# Patient Record
Sex: Female | Born: 1980 | Marital: Married | Smoking: Never smoker
Health system: Northeastern US, Community
[De-identification: ages and names within clinical notes are randomized; demographics above are authoritative.]

## PROBLEM LIST (undated history)

## (undated) VITALS — BP 101/59 | HR 64 | Temp 97.4°F | Resp 16 | Ht 64.5 in | Wt 178.0 lb

## (undated) DIAGNOSIS — N12 Tubulo-interstitial nephritis, not specified as acute or chronic: Secondary | ICD-10-CM

## (undated) HISTORY — DX: Tubulo-interstitial nephritis, not specified as acute or chronic: N12

## (undated) HISTORY — PX: OB ANTEPARTUM CARE CESAREAN DLVR & POSTPARTUM: REP299

## (undated) HISTORY — PX: CHOLECYSTECTOMY: GID484

## (undated) HISTORY — PX: LIPOSUCTION: SHX10

---

## 2005-02-11 HISTORY — PX: LAPAROSCOPY SURG CHOLECYSTECTOMY: GID642

## 2010-11-03 ENCOUNTER — Emergency Department (HOSPITAL_BASED_OUTPATIENT_CLINIC_OR_DEPARTMENT_OTHER)
Admission: RE | Admit: 2010-11-03 | Disposition: A | Discharge status: Home / Self Care | Payer: Self-pay | Source: Emergency Department | Attending: Emergency Medicine | Admitting: Emergency Medicine

## 2010-11-03 ENCOUNTER — Encounter (HOSPITAL_BASED_OUTPATIENT_CLINIC_OR_DEPARTMENT_OTHER): Payer: Self-pay | Admitting: Emergency Medicine

## 2010-11-03 LAB — URINALYSIS
BILIRUBIN, URINE: NEGATIVE
CASTS: NONE SEEN PER LPF
CRYSTALS: NONE SEEN
GLUCOSE, URINE: NEGATIVE MG/DL
KETONE, URINE: NEGATIVE MG/DL
NITRITE, URINE: NEGATIVE
PH URINE: 5.5 (ref 5.0–8.0)
PROTEIN, URINE: NEGATIVE MG/DL
RENAL EPITHELIAL CELLS: NONE SEEN PER LPF (ref 0–?)
SPECIFIC GRAVITY URINE: 1.02 (ref 1.003–1.035)
TRANSITIONAL EPITHELIALS: NONE SEEN PER LPF (ref 0–2)

## 2010-11-03 LAB — URINE DIP (POINT OF CARE)
BILIRUBIN, URINE: NEGATIVE
GLUCOSE, URINE: NEGATIVE mg/dl
KETONE, URINE: NEGATIVE mg/dl
NITRITE, URINE: NEGATIVE
PH URINE: 5.5 (ref 5.0–8.0)
PROTEIN, URINE: NEGATIVE mg/dl (ref 0–15)
SPECIFIC GRAVITY URINE: 1.02 (ref 1.003–1.030)
UROBILINOGEN URINE: 0.2 mg/dl (ref 0.2–1.0)

## 2010-11-03 LAB — BASIC METABOLIC PANEL
ANION GAP: 3 mmol/L (ref 3–11)
BUN (UREA NITROGEN): 13 mg/dl (ref 6–20)
CALCIUM: 8.8 mg/dl (ref 8.6–10.3)
CARBON DIOXIDE: 27 mmol/L (ref 22–32)
CHLORIDE: 104 mmol/L (ref 101–111)
CREATININE: 0.8 mg/dl (ref 0.4–1.2)
ESTIMATED GLOMERULAR FILT RATE: >60 mL/min (ref >60)
Glucose Random: 73 mg/dl — ABNORMAL LOW (ref 74–160)
POTASSIUM: 3.4 mmol/L — ABNORMAL LOW (ref 3.5–5.1)
SODIUM: 134 mmol/L — ABNORMAL LOW (ref 135–144)

## 2010-11-03 LAB — CBC WITH PLATELET
HEMATOCRIT: 41.2 % (ref 36.0–48.0)
HEMOGLOBIN: 13.5 g/dl (ref 12.0–16.0)
MEAN CORP HGB CONC: 32.8 g/dl (ref 32.0–36.0)
MEAN CORPUSCULAR HGB: 28.9 pg (ref 27.0–33.0)
MEAN CORPUSCULAR VOL: 88 fl (ref 80.0–100.0)
MEAN PLATELET VOLUME: 8.4 fl (ref 6.4–10.8)
PLATELET COUNT: 205 10*3/uL (ref 150–400)
RBC DISTRIBUTION WIDTH: 14.6 % — ABNORMAL HIGH (ref 11.5–14.3)
RED BLOOD CELL COUNT: 4.68 M/uL (ref 4.50–5.10)
WHITE BLOOD CELL COUNT: 5 10*3/uL (ref 4.0–10.8)

## 2010-11-03 LAB — URINE PREGNANCY TEST (POINT OF CARE): HCG QUALITATIVE URINE: NEGATIVE

## 2010-11-03 LAB — HOLD GREEN TOP TUBE

## 2010-11-03 LAB — HOLD BLUE TOP TUBE

## 2010-11-03 MED ORDER — IBUPROFEN 600 MG PO TABS
600.0000 mg | ORAL_TABLET | Freq: Three times a day (TID) | ORAL | Status: DC | PRN
Start: 2010-11-03 — End: 2012-02-20

## 2010-11-03 MED ORDER — CIPROFLOXACIN IN D5W 400 MG/200ML IV SOLN
400.00 mg | Freq: Once | INTRAVENOUS | Status: AC
Start: 2010-11-03 — End: 2010-11-03
  Administered 2010-11-03: 400 mg via INTRAVENOUS
  Filled 2010-11-03: qty 200

## 2010-11-03 MED ORDER — ONDANSETRON HCL 4 MG PO TABS
4.00 mg | ORAL_TABLET | Freq: Four times a day (QID) | ORAL | Status: AC | PRN
Start: 2010-11-03 — End: 2010-12-03

## 2010-11-03 MED ORDER — ONDANSETRON HCL 4 MG/2ML IJ SOLN
4.00 mg | Freq: Once | INTRAMUSCULAR | Status: AC
Start: 2010-11-03 — End: 2010-11-03
  Administered 2010-11-03: 4 mg via INTRAVENOUS
  Filled 2010-11-03: qty 2

## 2010-11-03 MED ORDER — CIPROFLOXACIN HCL 500 MG PO TABS
500.00 mg | ORAL_TABLET | Freq: Two times a day (BID) | ORAL | Status: AC
Start: 2010-11-03 — End: 2010-11-13

## 2010-11-03 MED ORDER — PHENAZOPYRIDINE HCL 200 MG PO TABS
200.00 mg | ORAL_TABLET | Freq: Three times a day (TID) | ORAL | Status: AC | PRN
Start: 2010-11-03 — End: 2010-11-10

## 2010-11-03 MED ORDER — POTASSIUM CHLORIDE 20 MEQ OR TBCR
40.00 meq | EXTENDED_RELEASE_TABLET | Freq: Once | ORAL | Status: AC
Start: 2010-11-03 — End: 2010-11-03
  Administered 2010-11-03: 40 meq via ORAL
  Filled 2010-11-03: qty 2

## 2010-11-03 MED ORDER — SODIUM CHLORIDE 0.9 % IV BOLUS
1000.00 mL | Freq: Once | INTRAVENOUS | Status: AC
Start: 2010-11-03 — End: 2010-11-03
  Administered 2010-11-03: 1000 mL via INTRAVENOUS

## 2010-11-03 MED ORDER — KETOROLAC TROMETHAMINE 30 MG/ML IJ SOLN
30.00 mg | Freq: Once | INTRAMUSCULAR | Status: AC
Start: 2010-11-03 — End: 2010-11-03
  Administered 2010-11-03: 30 mg via INTRAVENOUS
  Filled 2010-11-03: qty 1

## 2010-11-03 NOTE — Discharge Instructions (Signed)
Thank you for visiting the [facility] Emergency Department.   We have not found any emergent physical exam or lab abnormalities that would require admission to the hospital today.     If you had a CT scan or X-Ray performed between the hours of 5 PM-7 AM your results are only preliminary.  Final CT and X-Ray reports will be available through medical records within 24 hours.   Sometimes incidental findings are found on your radiographs after you leave the ED that will require non-emergent  follow up.    Diagnosis or diagnoses:  Pyelonephritis (Urinary tract infection that involves the kidneys)    What we did in the Emergency Department (ED):  Urine analysis- which showed evidence of a urinary tract infection  Urine culture-this will take 1-2 days return, but will ensure that you are on the correct antibiotic-you would be called at the number you registered with a a change in your antibiotic is indicated  Blood work-which was normal except for slightly low potassium    Next steps:  - Complete the entire course of antibiotic  - Follow up with your regular doctor within 2 days or call to establish care  - Take Pyridium for burning with urination  - Take Zofran for nausea  - Alternate doses of Tylenol (acetaminophen) with Motrin/Advil (ibuprofen) to control fever/pain.  Wait 6 hours between the SAME kind of medication.  An example schedule follows:       6:00am   Motrin/Advil (ibuprofen)  600 MG = 3 TABLETS, TAKE WITH FOOD!     9:00am   Tylenol (acetaminophen)  975 MG = 3 REGULAR strength tablets     12:00noon   Motrin/Advil     3:00pm   Tylenol     6:00pm   Motrin/Advil     9:00pm   Tylenol    Come back to the Emergency Department (ED) for:  Fever despite 1 day of antibiotics, vomiting such that you cannot tolerate medications/fluids, worsening pain, new back pain, any other concern      If you do not have a Primary Care Physician to follow up with, please let us know.  We have clinics available in [place],  [place], and [place] that we can arrange follow up within 24-48 hours.  It is always important to establish a PCP to take care of your everyday medical needs and make sure you are improving after your ER visit. You are welcome to establish care at [facility] by calling our DocFinder service at   [phone]   Monday-Friday 9am-5p if you do not have a regular doctor.    Please read any other attached instructions, and feel welcome to return to the ED for any concerning symptoms you may have.

## 2010-11-03 NOTE — ED Triage Note (Signed)
Multiple C/O's:  Possible UTI related.  Diffuse low abd/pelvic pain that radiates to RT low flank, denies hematuria, low grade fevers x3 days, & diarrhea.

## 2010-11-03 NOTE — ED Notes (Signed)
Pt education for diagnosis, medications, activity, diet and follow up provided.  Pt given written instructions and verbalized understanding of instructions.  Pt discharged home.

## 2010-11-03 NOTE — ED Provider Notes (Signed)
I have reviewed the ED nursing notes and prior records. I have reviewed the patient's past medical history/problem list, allergies, social history and medication list.  I saw this patient primarily.    History, physical exam and disposition were conducted with an official hospital [place] interpreter.    HPI:  This 30 year old female patient presents with chief complaint of right flank pain.  She has had 3 days of low-grade fevers, burning with urination, increased frequency of urination, right flank pain and suprapubic discomfort.  The pain is located in her right flank and radiates into her abdomen.  She has never had anything similar to this in the past.  There is no vomiting, difficulty breathing, chest pain, diarrhea, black or bloody stool.  She has been taking Motrin for discomfort with some relief, and her last dose was this morning around 7 AM about 9 hours prior to arrival in emergency department.  The pain has been constant for the last 3 days although it waxes and wanes in intensity based on whether or not the patient takes Motrin.    She also report an episode were she passed out in an airport in [place] on the way to [place], and was transported to the emergency department where she was observed overnight. She does not have a regular doctor because she recently moved to the [place] from [place].     ROS: Pertinent positives were reviewed as per the HPI above. All other systems were reviewed and are negative.    Past Medical History/Problem List:  History reviewed. No pertinent past medical history.  There is no problem list on file for this patient.      Past Surgical History:    Past Surgical History    LAPS SURG CHOLECSTC     LIPOSUCTION     OB ANTEPARTUM CARE C DLVR&POSTPARTUM          Medications:     No current facility-administered medications on file prior to encounter.  Current outpatient prescriptions ordered prior to encounter:  ibuprofen (ADVIL,MOTRIN) 600 MG tablet Take 1 tablet by  mouth every 8 (eight) hours as needed for Pain. pain Disp: 20 tablet Rfl: 0         Social History:  Denies smoking tobacco.  Denies alcohol use. Denies use of illicit drugs of abuse.    Allergies:  Review of Patient's Allergies indicates:   Penicillins             Hives    Physical Exam:  BP 112/74  Pulse 68  Temp 96.6 F  Resp 18  Wt 58.968 kg (130 lb)  SpO2 99%  LMP 10/20/2010  GENERAL:  Well-appearing, no distress.  Conversant in full sentences  SKIN:  Warm & Dry, no rash, no bruising.  HEAD:  Atraumatic. PERRL. Anicteric sclera. Oropharynx clear.  NECK:  Supple with full painless ROM at the neck  LUNGS:  Clear to auscultation bilaterally without rales, rhonchi or wheezing.   HEART:  RRR.  No murmurs, rubs, or gallops.   ABDOMEN:  Soft, flat, without distension.  Mild suprapubic discomfort to palpation.  MUSCULOSKELETAL:  No deformities. Well-perfused extremities with  2+ DP/PT/Rad pulses bilaterally. No cyanosis or edema.  GENITOURINARY: Positive right-sided CVA tenderness.   NEUROLOGIC:  Normal speech.  alert & oriented x 3, CNsII-XII grossly intact. Gait normal.  PSYCHIATRIC:  Normal affect    DIAGNOSTICS:   Results for orders placed during the hospital encounter of 11/03/10 (from the past 24 hour(s))  URINE DIP (POINT OF CARE)   Component Value Comment    COLOR yellow      CLARITY cloudy      GLUCOSE, URINE neg      BILIRUBIN, URINE neg      KETONE, URINE neg      SPECIFIC GRAVITY URINE 1.020      OCCULT BLOOD, URINE moderate      PH URINE 5.5      PROTEIN, URINE neg      UROBILINOGEN URINE 0.2      NITRITE, URINE neg      LEUKOCYTE ESTERASE small     URINE PREGNANCY TEST (POINT OF CARE)   Component Value Comment    HCG QUALITATIVE URINE negative      ONBOARD CONTROL PRESENT? yes     CBC + PLT    Collection Time    11/03/10  2:25 PM   Component Value Comment    WHITE BLOOD CELL 5.0      RED BLOOD CELL COUNT 4.68      HEMOGLOBIN 13.5      HEMATOCRIT 41.2      MEAN CORPUSCULAR VOL 88.0       MEAN CORPUSCULAR HGB 28.9      MEAN CORP HGB CONC 32.8      RBC DISTRIBUTION WIDTH 14.6 (*)     PLATELET COUNT 205      MEAN PLATELET VOLUME 8.4     BASIC METABOLIC PANEL    Collection Time    11/03/10  2:25 PM   Component Value Comment    SODIUM 134 (*)     POTASSIUM 3.4 (*)     CHLORIDE 104      CARBON DIOXIDE 27      ANION GAP 3      CALCIUM 8.8      Glucose Random 73 (*)     BLOOD UREA NITROGEN 13      CREATININE 0.8      ESTIMATED GLOMERULAR FILT RATE > 60     URINALYSIS    Collection Time    11/03/10  2:25 PM   Component Value Comment    COLOR YELLOW      CLARITY CLEAR      GLUCOSE, URINE NEGATIVE      BILIRUBIN, URINE NEGATIVE      KETONE, URINE NEGATIVE      SPECIFIC GRAVITY URINE 1.020      OCCULT BLOOD, URINE SMALL (*)     PH URINE 5.5      PROTEIN, URINE NEGATIVE      NITRITE, URINE NEGATIVE      LEUKOCYTE ESTERASE SMALL (*)     MICROSCOPIC SEE RESULTS (*)     WHITE BLOOD CELLS URINE MANY 10-50 (*)     RED BLOOD CELLS URINE FEW 3-4 (*)     BACTERIA 6-9 (*)     CRYSTALS NONE SEEN      CASTS NONE SEEN      SQUAMOUS EPITHELIAL CELLS 0-4      TRANSITIONAL EPITHELIALS NONE SEEN      RENAL EPITHELIAL CELLS NONE SEEN     HOLD GREEN TOPE TUBE    Collection Time    11/03/10  2:25 PM   Component Value Comment    HOLD GREEN TOP RECEIVED IN CHEM     HOLD BLUE TOP TUBE    Collection Time    11/03/10  2:25 PM   Component Value Comment    HOLD BLUE TOP  RECEIVED IN HEMATOL         ECG on my reading: NSR, nl axis/intervals, no evidence of ischemia, no STEMI    ED Course and Medical Decision-making:    The patient is 30 year old female with suprapubic pain, right flank pain, urinary symptoms, and low-grade fevers although she is afebrile at time of evaluation in the emergency department.  She does have urinary tract infection, and her symptoms are consistent with pyelonephritis.  Check blood work which did not show leukocytosis or significant anemia.  We also checked an  electrocardiogram given the history of syncope, and this was reassuring without evidence of predisposing condition to cardiac arrhythmia.  There is no evidence by physical exam her blood work of other significant intra-abdominal pathology such as appendicitis or colitis, so did not think that imaging of her abdomen is indicated at this time. The patient was treated with IV fluids, Zofran, Toradol, and IV ciprofloxacin with improvement in her symptoms.  She'll be discharged home with prescriptions for Cipro x10 days, Motrin, Zofran, Pyridium, as well as a referral to the docfinder number. Medication list and nursing note reviewed.  Reasons to return to the ED were reviewed in detail. The patient agrees with this plan and disposition.    Disposition:  Discharged to home    Condition on  Discharge:  Improved and Stable    Diagnosis/Diagnoses:  590.80BE Pyelonephritis    Critical care time outside of procedures: 0    Warren Lacy MD

## 2010-11-03 NOTE — ED Notes (Signed)
MD at bedside. 

## 2010-11-03 NOTE — ED Notes (Signed)
IVL established, labs drawn/sent by this RN

## 2010-11-05 LAB — EKG

## 2010-11-06 ENCOUNTER — Ambulatory Visit (HOSPITAL_BASED_OUTPATIENT_CLINIC_OR_DEPARTMENT_OTHER): Payer: Commercial Managed Care - PPO | Admitting: Family Medicine

## 2011-01-30 ENCOUNTER — Ambulatory Visit (HOSPITAL_BASED_OUTPATIENT_CLINIC_OR_DEPARTMENT_OTHER): Payer: 59 | Admitting: Internal Medicine

## 2011-03-22 ENCOUNTER — Ambulatory Visit (HOSPITAL_BASED_OUTPATIENT_CLINIC_OR_DEPARTMENT_OTHER): Payer: 59 | Admitting: Family Medicine

## 2011-04-08 ENCOUNTER — Ambulatory Visit (HOSPITAL_BASED_OUTPATIENT_CLINIC_OR_DEPARTMENT_OTHER): Payer: Commercial Managed Care - PPO | Admitting: Family Medicine

## 2011-04-08 ENCOUNTER — Encounter (HOSPITAL_BASED_OUTPATIENT_CLINIC_OR_DEPARTMENT_OTHER): Payer: Self-pay | Admitting: Family Medicine

## 2011-04-08 VITALS — BP 110/70 | HR 72 | Temp 97.9°F | Resp 16 | Ht 66.93 in | Wt 158.0 lb

## 2011-04-08 DIAGNOSIS — E039 Hypothyroidism, unspecified: Principal | ICD-10-CM

## 2011-04-08 DIAGNOSIS — O99281 Endocrine, nutritional and metabolic diseases complicating pregnancy, first trimester: Secondary | ICD-10-CM | POA: Insufficient documentation

## 2011-04-08 LAB — BASIC METABOLIC PANEL FASTING
ANION GAP: 5 mmol/L (ref 3–11)
BUN (UREA NITROGEN): 13 mg/dl (ref 6–20)
CALCIUM: 9.5 mg/dl (ref 8.6–10.3)
CARBON DIOXIDE: 28 mmol/L (ref 22–32)
CHLORIDE: 105 mmol/L (ref 101–111)
CREATININE: 0.7 mg/dl (ref 0.4–1.2)
ESTIMATED GLOMERULAR FILT RATE: >60 mL/min (ref >60)
GLUCOSE FASTING: 95 mg/dl (ref 74–118)
POTASSIUM: 3.9 mmol/L (ref 3.5–5.1)
SODIUM: 138 mmol/L (ref 135–144)

## 2011-04-08 LAB — CHG LIPOPROTEIN DIRECT MEASUREMENT LDL CHOLESTEROL: LOW DENSITY LIPOPROTEIN DIRECT: 74 mg/dl (ref 0–100)

## 2011-04-08 LAB — CBC WITH PLATELET
HEMATOCRIT: 40.1 % (ref 34.1–44.9)
HEMOGLOBIN: 14.1 g/dL (ref 11.2–15.7)
MEAN CORP HGB CONC: 35.2 g/dL (ref 31.0–37.0)
MEAN CORPUSCULAR HGB: 30.1 pg (ref 26.0–34.0)
MEAN CORPUSCULAR VOL: 85.7 fL (ref 80.0–100.0)
MEAN PLATELET VOLUME: 10.9 fL (ref 8.7–12.5)
PLATELET COUNT: 249 10*3/uL (ref 150–400)
RBC DISTRIBUTION WIDTH STD DEV: 40.7 fL (ref 35.1–46.3)
RBC DISTRIBUTION WIDTH: 13.1 % (ref 11.5–14.3)
RED BLOOD CELL COUNT: 4.68 M/uL (ref 3.90–5.20)
WHITE BLOOD CELL COUNT: 5.5 10*3/uL (ref 4.0–11.0)

## 2011-04-08 LAB — THYROID SCREEN TSH REFLEX FT4: THYROID SCREEN TSH REFLEX FT4: 1.04 u[IU]/mL (ref 0.34–5.60)

## 2011-04-08 LAB — CHG LIPOPROTEIN DIR MEAS HIGH DENSITY CHOLESTEROL: HIGH DENSITY LIPOPROTEIN: 38 mg/dl (ref 35–85)

## 2011-04-08 LAB — CHOLESTEROL: Cholesterol: 131 mg/dl (ref 0–200)

## 2011-04-08 MED ORDER — LEVOTHYROXINE SODIUM 100 MCG PO TABS
100.0000 ug | ORAL_TABLET | Freq: Every day | ORAL | Status: DC
Start: 2011-04-08 — End: 2012-03-05

## 2011-04-08 NOTE — Progress Notes (Signed)
SUBJECTIVE:  Brenda Braun is a 31 year old female who presents as new patient and history of hypothyroidism, needing medication refill.     LMP: 1/22.  Has copper T IUD and having significantly heavy menses.  Would like appointment to evaluate IUD and PAP.  Last TSH was about a year ago in [place].  No change in hair or skin texture.  No heat or cold intolerance.    ROS:  No fevers or chills. No unusually severe headaches. No dyspnea or chest pain. No abdominal pain. No sadness or anxiety that interferes with daily activities, although was feeling lonely when she moved from [place].     ALLLERGIES:   ALLLERGIES:  Review of Patient's Allergies indicates:   Penicillins             Hives      OBJECTIVE:    VITALS: VITALS:  BP 110/70  Pulse 72  Temp(Src) 97.9 F (36.6 C) (Temporal)  Resp 16  Ht 5' 6.93" (1.7 m)  Wt 158 lb (71.668 kg)  BMI 24.80 kg/m2  SpO2 98%  Constitutional:  Well developed, Well nourished, No acute distress, Non-toxic appearance.   HEENT: Normocephalic, Atraumatic, Bilateral external ears normal, TMs clear b/l, Oropharynx moist, No exudates, Nares clear, Conjunctiva with out erythema/injection.  The neck is supple and free of adenopathy or masses, the thyroid is normal without enlargement or nodules.  Cardiovascular: Normal heart rate, No murmurs, No rubs, No gallops.   Thorax & Lungs: Normal breath sounds, No respiratory distress, No wheezing, No chest tenderness.   Abdomen: Soft, Non tender, No guarding, No masses, Normal bowel sounds    ASSESSMENT/PLAN:   244.9 Unspecified hypothyroidism  (primary encounter diagnosis)  Comment:   Plan: levothyroxine (SYNTHROID, LEVOTHROID) 100 MCG         tablet, THYROID SCREEN TSH, COLLECTION VENOUS         BLOOD VENIPUNCTURE, CBC + PLT, BASIC METABOLIC         PANEL FASTING, CHOLESTEROL, HIGH DENSITY         LIPOPROTEIN, LOW DENSITY LIPOPROTEIN,DIRECT          RTC for PAP, CPE and IUD check.  Discussed possibly changing IUD to Mirena  to help with heavy menses.        1. The patient indicates understanding of these issues and agrees with the plan.  2.  The patient is given an After Visit Summary sheet that lists all of their medications with directions, their allergies, orders placed during this encounter, immunization dates, and follow- up instructions.  3. I reviewed the patient's medical information, allergies, and social history and reconciled the patient's medication list.        Electronically signed by: Tawny Asal, MD, 01/01/2010 7:17 PM  This note is electronically signed in the electronic medical record.

## 2011-05-06 ENCOUNTER — Ambulatory Visit (HOSPITAL_BASED_OUTPATIENT_CLINIC_OR_DEPARTMENT_OTHER): Payer: Commercial Managed Care - PPO | Admitting: Family Medicine

## 2011-05-06 ENCOUNTER — Encounter (HOSPITAL_BASED_OUTPATIENT_CLINIC_OR_DEPARTMENT_OTHER): Payer: Self-pay | Admitting: Family Medicine

## 2011-05-06 VITALS — BP 116/68 | HR 75 | Temp 98.4°F | Resp 16 | Wt 161.0 lb

## 2011-05-06 DIAGNOSIS — E039 Hypothyroidism, unspecified: Principal | ICD-10-CM

## 2011-05-06 NOTE — Patient Instructions (Signed)
Results for CHARNIQUA, VANMARTER (MRN [id number]) as of 05/06/2011 13:41   Ref. Range 04/08/2011 16:29   WHITE BLOOD CELL Latest Range: 4.0-11.0 TH/uL 5.5   RED BLOOD CELL COUNT Latest Range: 3.90-5.20 M/uL 4.68   HEMOGLOBIN Latest Range: 11.2-15.7 g/dL 91.4   HEMATOCRIT Latest Range: 34.1-44.9 % 40.1   MEAN CORPUSCULAR HGB Latest Range: 26.0-34.0 pg 30.1   RBC DISTRIBUTION WIDTH STD Latest Range: 35.1-46.3 fL 40.7   MEAN CORP HGB CONC Latest Range: 31.0-37.0 g/dL 78.2   MEAN CORPUSCULAR VOL Latest Range: 80.0-100.0 fL 85.7   MEAN PLATELET VOLUME Latest Range: 8.7-12.5 fL 10.9   RBC DISTRIBUTION WIDTH Latest Range: 11.5-14.3 % 13.1   PLATELET COUNT Latest Range: 150-400 TH/uL 249   SODIUM Latest Range: 135-144 mmol/L 138   POTASSIUM Latest Range: 3.5-5.1 mmol/L 3.9   CHLORIDE Latest Range: 101-111 mmol/L 105   CARBON DIOXIDE Latest Range: 22-32 mmol/L 28   ANION GAP Latest Range: 3-11 mmol/L 5   BLOOD UREA NITROGEN Latest Range: 6-20 mg/dl 13   CREATININE Latest Range: 0.4-1.2 mg/dl 0.7   ESTIMATED GLOMERULAR FILT RATE Latest Range: Low: > 60 ML/MIN > 60   GLUCOSE FASTING Latest Range: 74-118 mg/dl 95   CALCIUM Latest Range: 8.6-10.3 mg/dl 9.5   Cholesterol Latest Range: 0-200 mg/dl 956   HDL Latest Range: 35-85 mg/dl 38   LDL Latest Range: 0-100 mg/dl 74   THYROID SCREEN TSH Latest Range: 0.34-5.60 uIU/mL 1.04

## 2011-05-07 NOTE — Progress Notes (Signed)
SUBJECTIVE:  Brenda Braun is a 31 year old female who presents here for annual gyn, although has her menses.    Thyroid:  No symptoms.  Taking medication.          ALLLERGIES:   ALLLERGIES:  Review of Patient's Allergies indicates:   Penicillins             Hives      OBJECTIVE:    VITALS: VITALS:  BP 116/68  Pulse 75  Temp(Src) 98.4 F (36.9 C) (Temporal)  Resp 16  Wt 161 lb (73.029 kg)  SpO2 99%  Constitutional:  Well developed, Well nourished, No acute distress, Non-toxic appearance.   HEENT: Normocephalic, Atraumatic, Bilateral external ears normal, Oropharynx moist, No exudates, Nares clear, Conjunctiva with out erythema/injection.  Neck:  No TMG.  Cardiovascular: Normal heart rate, No murmurs, No rubs, No gallops.   Thorax & Lungs: Normal breath sounds, No respiratory distress, No wheezing, No chest tenderness.       ASSESSMENT/PLAN:   244.9 Unspecified hypothyroidism  (primary encounter diagnosis)  Comment: taking meds and no symptoms.  Reviewed lab results  Plan: Continue meds.        1. The patient indicates understanding of these issues and agrees with the plan.  2.  The patient is given an After Visit Summary sheet that lists all of their medications with directions, their allergies, orders placed during this encounter, immunization dates, and follow- up instructions.  3. I reviewed the patient's medical information, allergies, and social history and reconciled the patient's medication list.        Electronically signed by: Tawny Asal, MD, 01/01/2010 7:17 PM  This note is electronically signed in the electronic medical record.

## 2011-05-20 ENCOUNTER — Ambulatory Visit (HOSPITAL_BASED_OUTPATIENT_CLINIC_OR_DEPARTMENT_OTHER): Payer: Commercial Managed Care - PPO | Admitting: Family Medicine

## 2011-06-03 ENCOUNTER — Ambulatory Visit (HOSPITAL_BASED_OUTPATIENT_CLINIC_OR_DEPARTMENT_OTHER): Payer: Commercial Managed Care - PPO | Admitting: Family Medicine

## 2011-06-03 ENCOUNTER — Encounter (HOSPITAL_BASED_OUTPATIENT_CLINIC_OR_DEPARTMENT_OTHER): Payer: Self-pay | Admitting: Family Medicine

## 2011-06-03 VITALS — BP 110/70 | HR 68 | Temp 96.9°F | Resp 16 | Wt 160.0 lb

## 2011-06-03 DIAGNOSIS — Z01419 Encounter for gynecological examination (general) (routine) without abnormal findings: Principal | ICD-10-CM

## 2011-06-03 DIAGNOSIS — N760 Acute vaginitis: Secondary | ICD-10-CM

## 2011-06-03 DIAGNOSIS — B9689 Other specified bacterial agents as the cause of diseases classified elsewhere: Secondary | ICD-10-CM

## 2011-06-03 MED ORDER — METRONIDAZOLE 500 MG PO TABS
2.00 g | ORAL_TABLET | Freq: Once | ORAL | Status: AC
Start: 2011-06-03 — End: 2011-06-03

## 2011-06-03 NOTE — Progress Notes (Signed)
31 year old woman here for annual gyn exam. She has not seen her primary care provider for an annual exam in the last year.     Obstetric History    No data available       CC/HPI: routine screening  Constant feeling of vaginal discharge for the last two months.  White milky discharge.No itching or discomfort.    05/07/11 LMP.  Had some cramping, but normal.  History of UTI last was in October 2008        Current outpatient prescriptions:  levothyroxine (SYNTHROID, LEVOTHROID) 100 MCG tablet Take 1 tablet by mouth daily. Disp: 30 tablet Rfl: 12   ibuprofen (ADVIL,MOTRIN) 600 MG tablet Take 1 tablet by mouth every 8 (eight) hours as needed for Pain. pain Disp: 20 tablet Rfl: 0       HRT: not applicable  ALLERGIES:  Penicillins    Menstrual Status:  pre menopausal    LMP: No LMP recorded.     Menses: regular every 1 months, lasting 5 days.  Flow: heavy  Sexual History: sexually active, monogamous  Past GYN History: negative, has IUD  no abnormal paps    Significant OB History: 2 pregnancies, 31yo and 31 yo.      Past Medical History    Pyelonephritis              Past Surgical History    LAPAROSCOPY SURG CHOLECYSTECTOMY     LIPOSUCTION     OB ANTEPARTUM CARE CESAREAN DLVR & POSTPARTUM     CHOLECYSTECTOMY            Social History   Marital Status: Married  Spouse Name: N/A    Years of Education: N/A  Number of Children: N/A     Occupational History  None on file     Social History Main Topics   Smoking status: Never Smoker     Smokeless tobacco:     Alcohol Use: Not on file    Drug Use: Not on file    Sexually Active: Not on file     Other Topics Concern   None on file     Social History Narrative    Moved from [place] 6 months ago.    Lives with husband and children 7yo and 70 yo sons.    No working.     Student english.    Feels safe at home.                  No family history on file.    ROS:  Endocrine: negative  Genitourinary: some discharge for 2 months.      PHYSICAL EXAM:  Breasts: no masses, skin, nipple or  axillary changes, s/p breast surgery.  Abdomen: no masses or tenderness    PELVIC:  External Genitalia: normal architecture  Vagina: well rugated and no lesions  Vaginal Discharge: moderate, white and creamy  Pelvic supports: normal  Cervix: no lesions, strings visualized.  Uterus: anteverted, normal size and non-tender  Adnexa: no masses, nodularity, tenderness  Rectum: deferred  Wet prep shows some clue cells.    ASSESSMENT:  GYN annual exam  BV.    PLAN:  Pap done, Genprobe done, HPV DNA test done, Return in one year or as needed and Metronidazole 2g, x1 today.    COUNSELING:  exercise and alcohol/drug use  Declined immunizations.    .1. The patient indicates understanding of these issues and agrees with the plan.  2.  The patient is  given an After Visit Summary sheet that lists all of their medications with directions, their allergies, orders placed during this encounter, immunization dates, and follow- up instructions.  3. I reviewed the patient's medical information, allergies, and social history and reconciled the patient's medication list.        Electronically signed by: Tawny Asal, MD, 05/15/2009 2:06 PM  This note is electronically signed in the electronic medical record.

## 2011-06-04 DIAGNOSIS — Z30431 Encounter for routine checking of intrauterine contraceptive device: Secondary | ICD-10-CM | POA: Insufficient documentation

## 2011-06-04 LAB — CHLAMYDIA GC NAAT
GENPROBE CHLAMYDIA: NEGATIVE
GENPROBE GC: NEGATIVE

## 2011-06-11 LAB — CYTOPATH, C/V, THIN LAYER

## 2011-06-14 LAB — HUMAN PAPILLOMAVIRUS (HPV): HUMAN PAPILLOMAVIRUS: NEGATIVE

## 2011-06-17 ENCOUNTER — Encounter (HOSPITAL_BASED_OUTPATIENT_CLINIC_OR_DEPARTMENT_OTHER): Payer: Self-pay | Admitting: Family Medicine

## 2011-06-17 ENCOUNTER — Ambulatory Visit (HOSPITAL_BASED_OUTPATIENT_CLINIC_OR_DEPARTMENT_OTHER): Payer: Commercial Managed Care - PPO | Admitting: Family Medicine

## 2011-06-17 VITALS — BP 100/60 | HR 66 | Temp 98.3°F | Wt 158.0 lb

## 2011-06-17 DIAGNOSIS — R87610 Atypical squamous cells of undetermined significance on cytologic smear of cervix (ASC-US): Principal | ICD-10-CM

## 2011-06-17 DIAGNOSIS — E039 Hypothyroidism, unspecified: Secondary | ICD-10-CM

## 2011-06-17 MED ORDER — METRONIDAZOLE 0.75 % VA GEL
1.00 | Freq: Every evening | VAGINAL | Status: AC
Start: 2011-06-17 — End: 2011-06-22

## 2011-06-17 NOTE — Progress Notes (Signed)
SUBJECTIVE:  Brenda Braun is a 31 year old female who presents for f/u of pap and vaginal discharge. Had BV and was given Metronidazole PO, which helped, but still has some milky white discharge.  Reviewed Pap results:  ASCUS with Neg HPV.    ROS:  No fevers or chills. No unusually severe headaches. No dyspnea or chest pain. No abdominal pain.     ALLLERGIES:    Review of Patient's Allergies indicates:   Penicillins             Hives      OBJECTIVE:    VITALS: VITALS:  BP 100/60  Pulse 66  Temp(Src) 98.3 F (36.8 C) (Temporal)  Wt 158 lb (71.668 kg)  SpO2 99%  Constitutional:  Well developed, Well nourished, No acute distress, Non-toxic appearance.   HEENT: Normocephalic, Atraumatic,Conjunctiva with out erythema/injection.    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  Plan: f/u for yearly exam.  Pap q 3 years.    244.9 Unspecified hypothyroidism  Comment: stable  Plan: verified at pharmacy that she has refills.    Vaginal D/c:  Trial of Metrogel vaginally for 5 days to complete treatment.  If still with discharge, will consider culture and further evaluation        1. The patient indicates understanding of these issues and agrees with the plan.  2.  The patient is given an After Visit Summary sheet that lists all of their medications with directions, their allergies, orders placed during this encounter, immunization dates, and follow- up instructions.  3. I reviewed the patient's medical information, allergies, and social history and reconciled the patient's medication list.        Electronically signed by: Tawny Asal, MD, 06/17/2011, 12:56 PM    This note is electronically signed in the electronic medical record.

## 2012-02-20 ENCOUNTER — Ambulatory Visit (HOSPITAL_BASED_OUTPATIENT_CLINIC_OR_DEPARTMENT_OTHER): Payer: Self-pay

## 2012-02-20 ENCOUNTER — Ambulatory Visit (HOSPITAL_BASED_OUTPATIENT_CLINIC_OR_DEPARTMENT_OTHER): Payer: Commercial Managed Care - PPO

## 2012-02-20 VITALS — BP 110/70 | HR 56 | Temp 97.9°F | Wt 154.0 lb

## 2012-02-20 DIAGNOSIS — E039 Hypothyroidism, unspecified: Principal | ICD-10-CM

## 2012-02-20 DIAGNOSIS — F513 Sleepwalking [somnambulism]: Secondary | ICD-10-CM

## 2012-02-20 DIAGNOSIS — G43909 Migraine, unspecified, not intractable, without status migrainosus: Secondary | ICD-10-CM

## 2012-02-20 DIAGNOSIS — M25559 Pain in unspecified hip: Principal | ICD-10-CM

## 2012-02-20 DIAGNOSIS — W108XXA Fall (on) (from) other stairs and steps, initial encounter: Secondary | ICD-10-CM

## 2012-02-20 DIAGNOSIS — M7550 Bursitis of unspecified shoulder: Secondary | ICD-10-CM

## 2012-02-20 DIAGNOSIS — Z23 Encounter for immunization: Secondary | ICD-10-CM

## 2012-02-20 DIAGNOSIS — M25552 Pain in left hip: Secondary | ICD-10-CM

## 2012-02-20 LAB — XR PELVIS AP 1 OR 2 VIEWS

## 2012-02-20 LAB — THYROID SCREEN TSH REFLEX FT4: THYROID SCREEN TSH REFLEX FT4: 2.82 u[IU]/mL (ref 0.34–5.60)

## 2012-02-20 LAB — XR HIP LEFT MINIMUM 2 VIEWS

## 2012-02-20 MED ORDER — IBUPROFEN 800 MG PO TABS
800.0000 mg | ORAL_TABLET | Freq: Four times a day (QID) | ORAL | Status: DC | PRN
Start: 2012-02-20 — End: 2014-03-14

## 2012-02-20 MED ORDER — SUMATRIPTAN SUCCINATE 100 MG PO TABS
100.0000 mg | ORAL_TABLET | ORAL | Status: DC | PRN
Start: 2012-02-20 — End: 2013-11-08

## 2012-02-20 NOTE — Progress Notes (Signed)
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      Who presents for the following today:    1) Headaches  -come and go for many years, since she was a teenager  -unilateral, throbbing pain-usually on the left side  +Nausea  +Photophobia  +Dizziness- worse with head movement  +Tinnitus  -Headaches occur more often closer to her period  -Ibuprofen, tylenol with relief  +Nose bleeds-sometimes occur during headache  -No change in vision, trouble walking, loss of consciousness      2) Sleep walking  -Her husband noticed that she walks around in the middle of the night  -sometimes tries to get the keys and leave, laughs  -converses with husband while sleep walking  -he has witnessed her hitting her head while running into walls  -This occurs approximately 3 night per week for the past year  -Very tired the next day   -never remembers any of her sleep walking events      3) Left leg pain  -2 months ago fell down the stairs   -tripped and fell down 3 steps  -Fell on her left side, her left leg/buttocks area  -pain is worse with standing for long periods of time   -Relieved with rest  -No associated numbness, tingling, change in gait  -no other trauma    4) Right shoulder pain  - 2 months ago started having pain in her anterior "joint"  -occupation is folding clothes  -worse with moving shoulder at work   -no relieving factors  - no associated numbness, tingling,weakness    5) Hypothyroidism follow up  -taking 100 mcg Synthroid daily  -no heat/cold intolerance, changes in skin/hair/nails    Medications:  Current outpatient prescriptions:paragard intrauterine copper device, 1 Device by Intrauterine route once. As instructed , Disp: , Rfl: ;  levothyroxine (SYNTHROID, LEVOTHROID) 100 MCG tablet, Take 1 tablet  by mouth daily., Disp: 30 tablet, Rfl: 12;  ibuprofen (ADVIL,MOTRIN) 600 MG tablet, Take 1 tablet by mouth every 8 (eight) hours as needed for Pain. pain, Disp: 20 tablet, Rfl: 0      Allergies:  Review of Patient's Allergies indicates:   Penicillins             Hives        OBJECTIVE    PHYSICAL EXAM:  Vital Signs  BP 110/70  Pulse 56  Temp(Src) 97.9 F (36.6 C) (Temporal)  Wt 154 lb (69.854 kg)  BMI 24.17 kg/m2  SpO2 100%           Physical Exam   Constitutional: She appears well-nourished. She is cooperative. No distress.   Well groomed, well developed [place] female sitting comfortably on exam table. Accompanied by husband   HENT:   Head: Normocephalic and atraumatic.   Eyes: EOM are normal. Pupils are equal, round, and reactive to light.   Neck: Normal range of motion. No spinous process tenderness and no muscular tenderness present. No rigidity. No mass and no thyromegaly present.   Cardiovascular: Normal rate, regular rhythm, S1 normal and S2 normal.  Exam reveals no gallop.    No murmur heard.  Pulses:       Radial pulses are 2+ on the  right side, and 2+ on the left side.        Dorsalis pedis pulses are 2+ on the right side, and 2+ on the left side.   Pulmonary/Chest: Effort normal. She has no decreased breath sounds. She has no wheezes.   Musculoskeletal:        Right shoulder: She exhibits normal range of motion, no swelling, no effusion and no deformity.   Right shoulder:  -Tenderness to palpation in bicipital groove  +Neer Impingement sign  -Normal ROM    Left Hip:  FAROM  Tender to palpation over greater tronchater and ASIS  No swelling, crepitus       Neurological: She has normal strength. No cranial nerve deficit or sensory deficit. Gait normal.   Reflex Scores:       Patellar reflexes are 2+ on the right side and 2+ on the left side.  Skin: Skin is warm, dry and intact. No abrasion, no bruising and no ecchymosis noted. No erythema.   Psychiatric: Her speech is normal and behavior is normal.  Her mood appears anxious.         ASSESSMENT/PLAN  (244.9) Unspecified hypothyroidism  (primary encounter diagnosis)  Comment:Stable on current medication  Plan: THYROID SCREEN TSH REFLEX FT4    (719.45) Left hip pain  (E880.9) Fall down stairs.   XRAY to rule out bony abnormality. Works on her feet all day, may have early osteoarthritis vs. Bursitis.   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 Never had sleep studies.  Plan: REFERRAL TO SLEEP SPECIALIST (INT)    (726.19) Subacromial bursitis  Comment:   Plan: ibuprofen (ADVIL,MOTRIN) 800 MG tablet,         COLLECTION VENOUS BLOOD VENIPUNCTURE  -consider PT if no improvement    (346.90) Migraines  Comment: Counseled on potential triggers of headaches. Advised use of NSAIDs, fluids, use of Imitrex only if NSAIDs not working.  Plan: SUMAtriptan (IMITREX) 100 MG tablet  -return to care for follow up      I have spent 40 minutes in face to face time with this patient/patient proxy of which > 50% was in counseling or coordination of care regarding above issues/Dx.            1. The patient indicates understanding of these issues and agrees with the plan.  2.  The patient is given an After Visit Summary sheet that lists all of their medications with directions, their allergies, orders placed during this encounter, immunization dates, and follow- up instructions.  3. I reviewed the patient's medical information and medical history   4.  I reconciled the patient's medication list and prepared and supplied needed refills.  5.  I have reviewed the past medical, family, and social history sections including the medications and allergies listed in the above medical  record  Yuri Fana PA-C, 02/20/2012, 3:17 PM

## 2012-02-20 NOTE — Patient Instructions (Signed)
Dor no Ombro  (Shoulder Pain)   O ombro é a junta que conecta seus braços ao seu corpo. Os ossos que formam a junta do ombro incluem o osso do braço (úmero), a omoplata (espádua) e a clavícula (clavícula). A parte superior do úmero tem um formato esférico e se encaixa em uma cavidade plana da espádua (cavidade glenoidal). Uma combinação de músculos e tecidos fibrosos e fortes que ligam músculos a ossos (tendões) apoiam a junta de seu ombro e mantém a esfera na cavidade. Pequenas bolsas com fluidos (cavidades) estão localizadas em diferentes áreas da junta. Elas atuam como amortecedores entre os ossos e os tecidos finos superiores e ajudam a reduzir a fricção entre os tendões deslizantes e o osso conforme você move seu braço. A junta de seu ombro possibilita um grande alcance de movimentos em seu braço. Esse alcance de movimento possibilita que você realize tarefas como coçar as costas ou arremessar uma bola. No entanto, este alcance também torna seu ombro mais suscetível a dor devido a uso intenso e lesões.  As causas de dor no ombro podem ser provenientes de lesões ou uso intenso e normalmente podem ser agrupadas nas quatro categorias abaixo:   · Vermelhidão, inchaço e dor (inflamação) do tendão (tendinite) ou das cavidades (bursite).  · Instabilidade, como deslocamento da junta.  · Inflamação da junta (artrite).  · Osso quebrado (fratura).  INSTRUÇÕES PARA TRATAMENTO DOMICILIAR   · Aplique gelo na área lesionada  · Coloque gelo em uma sacola plástica.  · Coloque uma toalha entre a pele e a sacola.  · Deixe o gelo sobre a área por 15 a 20 minutos, 3 a 4 vezes por dia nos 2 primeiros dias.  · Caso você tenha uma tipoia ou imobilizador, use-os pelo tempo orientado por seu médico. Somente remova-os para tomar banho. Mova seu braço o mínimo possível, mas mantenha sua mão em movimento para evitar o inchaço.  · Somente administre remédios de venda livre ou prescritos contra dor, desconforto ou febre conforme orientado  por seu médico.  PROCURE UM MÉDICO SE:   · A dor de seu ombro aumentar ou aparecerem novas dores no braço, mão ou dedos.  · Sua mão ou dedos se tornarem frios ou dormentes.  · Não houver alívio de dor com remédios.  qqPROCURE UM MÉDICO IMEDIATAMENTE SE:   · Seu braço, mão ou dedos estiverem dormentes ou formigando.  · Seu braço, mão ou dedos estiverem inchados significativamente ou ficarem brancos ou azuis.  CERTIFIQUE-SE DE:   · Compreender estas instruções.  · Observar as suas condições.  · Procurar um médico imediatamente se não se sentir bem ou piorar.  Document Released: 07/18/2009 Document Revised: 04/22/2011  [employer]® Patient Information ©2013 [employer].

## 2012-02-21 ENCOUNTER — Encounter (HOSPITAL_BASED_OUTPATIENT_CLINIC_OR_DEPARTMENT_OTHER): Payer: Self-pay | Admitting: Family Medicine

## 2012-03-05 ENCOUNTER — Ambulatory Visit (HOSPITAL_BASED_OUTPATIENT_CLINIC_OR_DEPARTMENT_OTHER): Payer: Commercial Managed Care - PPO

## 2012-03-05 ENCOUNTER — Encounter (HOSPITAL_BASED_OUTPATIENT_CLINIC_OR_DEPARTMENT_OTHER): Payer: Self-pay

## 2012-03-05 VITALS — BP 108/68 | HR 65 | Temp 98.1°F | Wt 154.0 lb

## 2012-03-05 DIAGNOSIS — Z309 Encounter for contraceptive management, unspecified: Secondary | ICD-10-CM

## 2012-03-05 DIAGNOSIS — N920 Excessive and frequent menstruation with regular cycle: Secondary | ICD-10-CM

## 2012-03-05 DIAGNOSIS — R233 Spontaneous ecchymoses: Secondary | ICD-10-CM

## 2012-03-05 DIAGNOSIS — E039 Hypothyroidism, unspecified: Secondary | ICD-10-CM

## 2012-03-05 DIAGNOSIS — M25559 Pain in unspecified hip: Principal | ICD-10-CM

## 2012-03-05 DIAGNOSIS — Z30431 Encounter for routine checking of intrauterine contraceptive device: Secondary | ICD-10-CM

## 2012-03-05 DIAGNOSIS — M7550 Bursitis of unspecified shoulder: Secondary | ICD-10-CM

## 2012-03-05 DIAGNOSIS — R238 Other skin changes: Secondary | ICD-10-CM

## 2012-03-05 LAB — CBC WITH PLATELET
HEMATOCRIT: 39 % (ref 34.1–44.9)
HEMOGLOBIN: 13.7 g/dL (ref 11.2–15.7)
MEAN CORP HGB CONC: 35.1 g/dL (ref 31.0–37.0)
MEAN CORPUSCULAR HGB: 30.4 pg (ref 26.0–34.0)
MEAN CORPUSCULAR VOL: 86.7 fL (ref 80.0–100.0)
MEAN PLATELET VOLUME: 10.3 fL (ref 8.7–12.5)
PLATELET COUNT: 254 10*3/uL (ref 150–400)
RBC DISTRIBUTION WIDTH STD DEV: 42.1 fL (ref 35.1–46.3)
RBC DISTRIBUTION WIDTH: 13.2 % (ref 11.5–14.3)
RED BLOOD CELL COUNT: 4.5 M/uL (ref 3.90–5.20)
WHITE BLOOD CELL COUNT: 4.3 10*3/uL (ref 4.0–11.0)

## 2012-03-05 MED ORDER — LEVOTHYROXINE SODIUM 100 MCG PO TABS
100.0000 ug | ORAL_TABLET | Freq: Every day | ORAL | Status: DC
Start: 2012-03-05 — End: 2012-12-03
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
## 2012-03-17 ENCOUNTER — Encounter (HOSPITAL_BASED_OUTPATIENT_CLINIC_OR_DEPARTMENT_OTHER): Payer: Self-pay | Admitting: Geriatric Medicine

## 2012-03-17 ENCOUNTER — Ambulatory Visit (HOSPITAL_BASED_OUTPATIENT_CLINIC_OR_DEPARTMENT_OTHER): Payer: Commercial Managed Care - PPO | Admitting: Geriatric Medicine

## 2012-03-17 VITALS — BP 110/70 | HR 66 | Temp 97.9°F

## 2012-03-17 DIAGNOSIS — Z30430 Encounter for insertion of intrauterine contraceptive device: Principal | ICD-10-CM

## 2012-03-17 DIAGNOSIS — Z30432 Encounter for removal of intrauterine contraceptive device: Secondary | ICD-10-CM

## 2012-03-17 DIAGNOSIS — N920 Excessive and frequent menstruation with regular cycle: Secondary | ICD-10-CM

## 2012-03-17 LAB — URINE PREGNANCY TEST (POINT OF CARE): HCG QUALITATIVE URINE: NEGATIVE

## 2012-03-17 MED ORDER — LEVONORGESTREL 20 MCG/24HR IU IUD
1.0000 | INTRAUTERINE_SYSTEM | INTRAUTERINE | Status: DC
Start: 2012-03-17 — End: 2014-03-14

## 2012-03-17 MED ORDER — IBUPROFEN 600 MG PO TABS
600.0000 mg | ORAL_TABLET | Freq: Once | ORAL | Status: DC
Start: 2012-03-17 — End: 2012-03-17

## 2012-03-17 NOTE — Progress Notes (Signed)
PRECEPTOR NOTE - IUD Insertion   I discussed the care of the patient with the resident providing the service, was directly responsible for the patient's management and was physically present for the key aspects of the procedure.  The patient was counseled on her contraceptive options, has chosen a Mirena IUD, and has no contraindication to IUD insertion today. Negative Upreg with paragard that was removed today. Consent for the procedure is signed and scanned to the chart.  The uterus was sounded to 8cm and the IUD was inserted with minor difficulties due to cervical stenosis.  A handout detailing what to expect post IUD insertion was given and warning signs of complications were reviewed.  Mirena users are counseled to use back-up for 7 days following insertion. The patient was given the option to return for follow up.     Please see the resident's notes for a more detailed documentation of the patient encounter and the procedure.    Anderson Malta. Beatriz Settles, MD

## 2012-03-17 NOTE — Patient Instructions (Signed)
[employer]format[employer]  T de cobre ou Dispositivo Intrauterino ParaGard   (DIU)      O que é o DIU   O T de cobre ou DIU ParaGard é um método anticoncepcional que é colocado dentro do útero.  O DIU tem a forma da letra "T" e é feito de plástico ou de cobre (metal).  Ele também tem um cordão curto.  O cordão fica fora do útero, mas você não pode vê-lo por  fora da vagina.    Como funciona o DIU?  O DIU funciona ao impedir que o ovário libere um óvulo.  Ele também causa mudanças no útero que impedem o espermatozóide alcançar o óvulo.     O DIU funciona bem na prevenção da gravidez?  O DIU funciona muito bem na prevenção da gravidez.  Se 100 mulheres usarem o DIU por um ano, apenas uma mulher talvez fique grávida.  O DIU é um método anticoncepcional muito bom.     Fatos a se considerar:  - Com este método anticoncepcional é muito difícil que você engravide.   - Normalmente, você consegue engravidar assim que DIU é retirado.   - Ninguém precisa saber que você está usando o DIU se você não quiser que saibam.  - Você não precisa interromper as relações sexuais para usá-lo.  - O DIU pode ser usado por mulheres que não podem ou não querem usar métodos anticoncepcionais que contêm hormônios, tal como a pílula, os adesivos, o anel, a injeção,o implante ou o DIU Mirena.   - O DIU funciona por 10 anos, mas você pode pedir para um profissional de saúde retirá-lo a qualquer momento.   - As mulheres podem usar DIU mesmo quando estão amamentando.  - A DIU NÃO oferece nenhuma proteção contra o HIV nem outras doenças sexualmente transmissíveis (DSTs)  - O DIU pode causar mudanças na menstruação.  - Leva alguns meses para o organismo se acostumar ao DIU.   - O DIU tem que ser inserido e removido por um profissional de saúde.      Quais são os possíveis efeitos colaterais do DIU?      - Cólicas, tontura ou desmaio quando o DIU é inserido no útero  - Mudanças na menstruação como manchas de sangue, fluxo mais forte e mais longo e cólicas    - O seu parceiro pode sentir o cordão do DIU durante a relação sexual  - Maior corrimento vaginal  - Anemia se você tiver maior    perda de sangue durante a sua menstruação  - Dor nas costas  - Irritações cutâneas    Às vezes o DIU pode causar os seguintes problemas sérios de saúde:  - O DIU pode perfurar a parede do útero.  - Em caso de gravidez com o DIU, existe um risco maior da gravidez se implantar fora do útero  - O DIU pode sair do útero.    Retorne à sua clínica o mais breve possível se tiver algum dos seguintes:  - Dor na parte inferior do abdomem   - Dor durante as relações sexuais  - Uma febre inexplicável  - Sangramento vaginal forte ou sangramento que dura mais tempo do que o esperado  - Corrimento vaginal fora do normal   - Perder uma menstruação ou achar que pode estar grávida  - Não conseguir sentir o cordão do DIU ou se achar que os cordões estão muito longos  - Puder sentir o plástico duro do DIU    Entre em   contato com o seu profissional de saúde se você acha que pode estar com uma DTS ou se quiser parar de usar o DIU.    - Para diminuir o risco de transmissão do HIV e outras DSTs, use preservativos de látex todas as vezes que tiver relações sexuais, sejam elas vaginais, anais ou orais.   - Se você tiver relações sexuais e não estiver usando nenhum método anticoncepcional, ou se estiver usando um preservativo e ele furar ou sair, você pode usar o anticoncepcional de emergência (A.E.)  O A.E. ajuda a impedir a gravidez quando tomado o mais cedo possível.  Se você precisa do A.E. ligue para o seu profissional de saúde, conselheiro de planejamento familiar ou para uma farmácia.    Folha Informativa  Sistema Intrauterino Mirena (SIU)    O que é o SIU Mirena?   O SIU Mirena é um método anticoncepcional que é colocado dentro do útero.  Ele é feito de plástico e contém um hormônio.  Ele também tem um cordão pequeno.  O cordão fica fora do útero, mas você não pode vê-lo por fora da vagina.    Como  funciona o SIU Mirena?   O SIU funciona ao impedir que o ovário libere um óvulo.  Ele também causa mudanças no útero para que o espermatozóide não possa alcançar o óvulo.    O SIU Mirena funciona bem na prevenção da gravidez?  O SIU Mirena funciona muito bem para prevenir a gravidez.  Se 100 mulheres usarem o SIU Mirena por um ano, apenas uma mulher talvez fique grávida.  O SIU Mirena é um método anticoncepcional muito bom.    Fatos a se considerar:  - Com este método anticoncepcional é muito difícil que você engravide.   - Normalmente, você consegue engravidar assim que o SIU Mirena for retirado.   - O SIU Mirena é um método privativo.  - Você nã precisa interromper as relações sexuais para usá-lo.   - Algumas mulheres têm menos dores e menos sangramento durante a  menstruação  - O SIU Mirena funciona por até 5 anos.  - O SIU Mirena  NÃO oferece nenhuma proteção contra o HIV nem outras doenças sexualmente transmissíveis (DSTs)  - O SIU Mirena pode causar mudanças na menstruação.   - O SIU Mirena  tem que ser inserido e removido por um profissional de saúde.      Quais são os possíveis efeitos colaterais do SIU Mirena?     - Cólicas, tonturas ou desmaio durante a inserção do SIU Mirena Espinhas, pele oleosa, manchas ou irritação na pele  - Seios doloridos ou sensíveis  - Dor de [place]abe[place]  - Mudanças de humor    - Dores abdominais  - Vômito ou náuseas   - Corrimento vaginal maior do que o normal  - Dor nas costas  - Perda ou aumento de peso   - Menos desejo sexual    Às vezes o SIU Mirena pode causar os seguintes problemas sérios de saúde:  - O SIU Mirena pode perfurar a parede do útero.  - Em caso de gravidez com o Mirena, existe um risco maior da gravidez se implantar fora do útero  - O SIU Mirena pode sair do útero.  - Pressão arterial alta  - Cistos nos ovários    Retorne ao seu centro de saúde o mais breve possível se tiver algum dos seguintes:    - Dores abdominais   - Dor durante as relações sexuais  - Febre    inexplicável  - Menstruação forte que dura mais tempo do que o normal  - Corrimento vaginal fora do normal  - Dor de cabeça pior ou nova  - Falta de menstruação ou achar que pode estar grávida   - Não conseguir sentir o cordão ou se achar que os cordões estão muito longos  - Pode sentir o plástico duro do SIU  - Nódulo na mama    Entre em contato com o seu profissional de saúde se você acha que pode estar com uma DST ou se quiser parar de usar o SIU Mirena.    - Para diminuir o risco de transmissão do HIV e outras DSTs, use preservativos de látex todas as vezes que tiver relações sexuais, sejam elas vaginais, anais ou orais.    - Se você tiver relações sexuais e não estiver usando nenhum método anticoncepcional, ou se estiver usando um preservativo e ele furar ou sair, você pode usar o anticoncepcional de emergência (A.E.)  O A.E. ajuda a impedir a gravidez quando tomado o mais cedo possível.  Se você precisa do A.E. ligue para o seu profissional de saúde, conselheiro de planejamento familiar ou para uma farmácia.

## 2012-03-17 NOTE — Progress Notes (Signed)
PATIENT/PROCEDURE VERIFICATION DOCUMENTATION    Correct patient: Yes  Correct procedure: Yes  Correct side, site, mark visible if applicable: Yes  Correct position: Yes  Special equipment/implant(s) present, if applicable: Yes    Time-out completed, documented by provider doing procedure or designated team member:  Lovie Chol MD    03/17/2012    64:46 AM  32 year old female is here for an IUD insertion. Reason(s) for IUD: contraception and menorrhagia. Currently she is sexually active, monogamous and female partner. She denies current symptoms of possible pelvic infection, recent pelvic infection, pregnancy and allergy to IUD materials. Her last unprotected sexual encounter was more than 2 weeks ago. No LMP recorded. Labs completed include: negative pregnancy test.    Obstetric History    No data available    Patient has verbalized understanding of risks and benefits and has signed the consent form. Current statistical risk of pregnancy with IUD is 0.3-1%. Current statistical risk of ectopic pregnancy with IUD is low but if pregnancy occurs ectopic risk is about 35%.    EXAM:  Uterus: anteverted, normal size and non-tender.    PROCEDURE:  IUD type: Mirena. Cervix prepped with betadine. Tenaculum applied to anterior lip of the cervix. Uterus Sounds to 8cm. IUD inserted with minor difficulties due to cervical stenosis. Strings trimmed.    POST PAIN ASSESSMENT:  Post pain assessment done. Patient rates pain as a 1 on a 0-10 pain scale. Estimated blood loss: 5cc.    DISPOSITION AND FOLLOW UP:  Instructions given to patient regarding checking IUD strings, anticipated vaginal bleeding and warning signs. Instructed to call for any concerns. Follow up appointment in 6 weeks.

## 2012-03-23 ENCOUNTER — Encounter (HOSPITAL_BASED_OUTPATIENT_CLINIC_OR_DEPARTMENT_OTHER): Payer: Self-pay | Admitting: Registered Nurse

## 2012-03-30 ENCOUNTER — Ambulatory Visit (HOSPITAL_BASED_OUTPATIENT_CLINIC_OR_DEPARTMENT_OTHER): Payer: Self-pay

## 2012-04-15 ENCOUNTER — Ambulatory Visit (HOSPITAL_BASED_OUTPATIENT_CLINIC_OR_DEPARTMENT_OTHER): Payer: Commercial Managed Care - PPO

## 2012-06-17 ENCOUNTER — Ambulatory Visit (HOSPITAL_BASED_OUTPATIENT_CLINIC_OR_DEPARTMENT_OTHER): Payer: Commercial Managed Care - PPO

## 2012-06-17 ENCOUNTER — Encounter (HOSPITAL_BASED_OUTPATIENT_CLINIC_OR_DEPARTMENT_OTHER): Payer: Self-pay

## 2012-06-17 VITALS — BP 120/70 | HR 68 | Resp 16 | Ht 67.0 in | Wt 165.0 lb

## 2012-06-17 DIAGNOSIS — F513 Sleepwalking [somnambulism]: Principal | ICD-10-CM

## 2012-06-17 DIAGNOSIS — E039 Hypothyroidism, unspecified: Secondary | ICD-10-CM

## 2012-06-17 DIAGNOSIS — R51 Headache: Secondary | ICD-10-CM

## 2012-06-17 DIAGNOSIS — G4700 Insomnia, unspecified: Secondary | ICD-10-CM

## 2012-06-17 DIAGNOSIS — R519 Headache, unspecified: Secondary | ICD-10-CM

## 2012-06-17 NOTE — Progress Notes (Signed)
New pt   Pt  States that she  Has  a problem    Sleeping      Pt states that she walks in her sleep    Pt also  Talks  In her sleep           Pt needs further   evaluation   And possible treatment  Due to the language barrier, the office visit was conducted in [place] with an interpreter. The interpreter's name is . The interpreter was on the phone during the entire visit    Lives with family    Safe at home .

## 2012-06-17 NOTE — Patient Instructions (Signed)
1.  Return for sleep study  2. Avoid alcohol and sleep deprivation.   3.  Follow up two weeks after sleep study

## 2012-06-17 NOTE — Progress Notes (Signed)
Primary note dictated through E-Scription.  Dictation: [id number] See "Notes" tab in Chart Review for transcribed note; final note also appears within encounter.

## 2012-08-05 ENCOUNTER — Ambulatory Visit (HOSPITAL_BASED_OUTPATIENT_CLINIC_OR_DEPARTMENT_OTHER): Payer: Self-pay

## 2012-10-01 DIAGNOSIS — G2581 Restless legs syndrome: Secondary | ICD-10-CM | POA: Insufficient documentation

## 2012-10-01 NOTE — Progress Notes (Signed)
Date of Service: 06/17/2012    Jun 17, 2012    Willis Modena, MD  [facility]  [address]  [place], [place]  [address]    RE:  Brenda Braun    Dear Dr. Emelda Fear:    I had the pleasure of seeing your patient, Brenda Braun, on Jun 17, 2012.  As you may know, she is a very pleasant 32 year old female who is accompanied today by her husband.  She is a Portuguese-speaking female with no significant past medical history except for hypothyroidism, occasional headaches who presents for initial evaluation due to sleepwalking.  She says that it began about 13 years ago.  She woke up and found herself taking a shower.  She would have lapses where things were normal, but then other times she wakes up and goes back to bed.  She has also walked into the living room and watched TV.  Her husband also notes her talking a significant amount during her sleep.  She talks as if she is awake with full conversations.  In the way in which she talks she appears to be talking to people far away.  He has also seen her walk down stairs and putting on clothes.  Things that make it worse are times of stress or anxiety.  She apparently is also very sweet and not violent.  It currently is occurring 3 times per week and appears to have increased in frequency.  She does not remember any of these actions and will usually occur somewhere between 1 to 3 in the morning or otherwise at 4 to 5 a.m.  She does wake up with a significant number of headaches.  She typically goes to bed anywhere from 10 to 10:30 p.m.  She is able to fall asleep without a problem, though at times she has had bouts of insomnia.  When she was in [place] she noticed that she would stay up until about 1 a.m. and was unable to fall asleep.  She states that she does not wake up refreshed, nor does she get a good night of sleep.  Sometimes she feels that her sleep is very disruptive.    The only other complaint that she has is the headaches as I mentioned.  She  also has 2-3 times per week nosebleeds.  She has noticed bruising all over and easy bruisability.  Her headaches have been there since the age of 28, and it is usually in the pain in the neck on the left side.    In terms of other symptoms, she has never walked outside of the house.  She does not usually remember the dream that it is associated with.  She has banged her head on the wall.    SLEEP HYGIENE:  She denies any caffeine, no alcohol.  She does not use any sleeping aids.  She did take melatonin at one point.    PAST MEDICAL HISTORY:  As I mentioned the sleep walking, unspecified hypothyroidism, and abnormal Pap smear.    REVIEW OF SYSTEMS:  Constitutional:  Positive for malaise and fatigue.  No fever, no chills, no weight loss.  HEENT:  Positive for epistaxis, nasal congestion.  No sore throat or dry mouth.  Respiratory:  Negative for any cough, shortness of breath, wheezing.  Positive feeling of choking at night.  Positive occasional snoring, not on a routine basis.  Cardiovascular:  Negative for palpations.  No chest pain.  GI:  No heartburn, no nausea, no vomiting.  GU:  No nocturia, dysuria, or frequency.  Skin:  No itching or rash.  Neurologic:  Positive headaches.  Musculoskeletal:  Negative for back pain.  Psychiatric/behavioral:  Negative for any depression or memory loss.    FAMILY HISTORY:  No family history of sleep walking or sleep talking or other sleep disorders.    SOCIAL HISTORY:  No tobacco abuse.  No alcohol abuse.  She currently lives with her husband.    MEDICATIONS:  Include Mirena, Synthroid 100 mcg 1 tablet by mouth, ibuprofen, Imitrex for the headaches.    PHYSICAL EXAMINATION:  VITAL SIGNS:  Showed a blood pressure of 120/70 with pulse of 68, respiratory rate of 16.  Her height is 5 feet 7 inches, weight of 165 pounds with a BMI of 25.  Sats are 98%.  GENERAL:  She is a well-developed, normal woman in no acute distress.  HEENT:  Extraocular movements are intact.  Sinuses:  No clear  congestion.  No sinus tenderness.  Oropharynx:  No erythema or exudate.  Mallampati score III.  NECK:  Supple.  No obvious thyromegaly, no tracheal deviation.  No epistaxis was noted.  CARDIOVASCULAR:  Regular; S1, S2; no murmurs.  LUNGS:  Clear to auscultation bilaterally.  No wheezes, rales, or rhonchi.  ABDOMEN:  Soft, nontender, nondistended.  EXTREMITIES:  No clubbing or edema.    DIAGNOSTIC DATA:  TSH last one was normal at 2.82.    IMPRESSION:  This is a 32 year old female with apparent sleepwalking.  Differential includes parasomnias, non-REM likely versus REM, as well as potential nocturnal seizure disorder.  Much less likely seizure activity as her movements are not stereotypical in nature.  Due to the parasomnias I am concerned for potential harm.  I do recommend that we first pursue a sleep study.  This sleep study would be helpful in ruling out any disruptions to her sleep which can be an underlying cause of sleepwalking.  The sleep study will also help [place] determine if she is having any seizure activity.  We may not be able to pick up the parasomnias on her night of the sleep study, but hopefully we will be able to.  I have advised her to make sure that she gets enough sleep, avoids sleep deprivation, and maintains a regular sleep cycle as this can definitely make any parasomnia worse.  She was advised to maintain proper sleep hygiene, not lay in her bed longer than 20 minutes, particularly when she is unable to get to sleep.  If it turns out that this is parasomnias with potential of harming herself clonazepam can be considered at low dose at night.  I have advised her to avoid alcohol as this can make the parasomnias worse, as well.  She should continue treatment for her hypothyroidism and to monitor routine TSHs.     A total of 30 minutes was spent face-to-face with this patient during this encounter, and over 50% of that time was spent on counseling and explaining her underlying disease and  concerns, and I will plan to see her for followup after the sleep study.  If her symptoms worsen before then the patient and her husband were advised to contact me.  She should also follow up for the headaches, particularly if they are getting worse.    Sincerely,  ___________________________  Reviewed and Electronically Signed By: Jennette Banker DO  Sig Date: 11/30/2012  Sig Time: 12:46:08  Dictated By: Jennette Banker DO  Dict Date: 10/01/2012 Dict Time: 21  27 PM    Dictation Date and Time:10/01/2012 15:27:19  Transcription Date and Time:10/01/2012 16:16:45  eScription Dictation id: [id number] Confirmation # :[id number]      cc: Willis Modena MD

## 2012-10-27 ENCOUNTER — Ambulatory Visit (HOSPITAL_BASED_OUTPATIENT_CLINIC_OR_DEPARTMENT_OTHER): Payer: Commercial Managed Care - PPO

## 2012-11-09 ENCOUNTER — Ambulatory Visit (HOSPITAL_BASED_OUTPATIENT_CLINIC_OR_DEPARTMENT_OTHER): Payer: Commercial Managed Care - PPO | Admitting: Family Medicine

## 2012-11-09 ENCOUNTER — Encounter (HOSPITAL_BASED_OUTPATIENT_CLINIC_OR_DEPARTMENT_OTHER): Payer: Self-pay | Admitting: Family Medicine

## 2012-11-09 VITALS — BP 110/70 | HR 62 | Temp 97.7°F | Wt 163.0 lb

## 2012-11-09 DIAGNOSIS — F330 Major depressive disorder, recurrent, mild: Principal | ICD-10-CM

## 2012-11-09 DIAGNOSIS — R300 Dysuria: Secondary | ICD-10-CM

## 2012-11-09 DIAGNOSIS — Z23 Encounter for immunization: Secondary | ICD-10-CM

## 2012-11-09 LAB — CBC WITH PLATELET
HEMATOCRIT: 40.7 % (ref 34.1–44.9)
HEMOGLOBIN: 14 g/dL (ref 11.2–15.7)
MEAN CORP HGB CONC: 34.4 g/dL (ref 31.0–37.0)
MEAN CORPUSCULAR HGB: 29.5 pg (ref 26.0–34.0)
MEAN CORPUSCULAR VOL: 85.7 fL (ref 80.0–100.0)
MEAN PLATELET VOLUME: 11 fL (ref 8.7–12.5)
PLATELET COUNT: 224 10*3/uL (ref 150–400)
RBC DISTRIBUTION WIDTH STD DEV: 40 fL (ref 35.1–46.3)
RBC DISTRIBUTION WIDTH: 12.7 % (ref 11.5–14.3)
RED BLOOD CELL COUNT: 4.75 M/uL (ref 3.90–5.20)
WHITE BLOOD CELL COUNT: 4.7 10*3/uL (ref 4.0–11.0)

## 2012-11-09 LAB — URINE DIP (POINT OF CARE)
BILIRUBIN, URINE: NEGATIVE
GLUCOSE, URINE: NEGATIVE mg/dl
KETONE, URINE: NEGATIVE mg/dl
LEUKOCYTE ESTERASE: NEGATIVE
NITRITE, URINE: NEGATIVE
PH URINE: 5.5 (ref 5.0–8.0)
PROTEIN, URINE: NEGATIVE mg/dl (ref 0–15)
SPECIFIC GRAVITY URINE: 1.015 (ref 1.003–1.030)
UROBILINOGEN URINE: 0.2 mg/dl (ref 0.2–1.0)

## 2012-11-09 LAB — URINALYSIS
BILIRUBIN, URINE: NEGATIVE
CASTS: NONE SEEN PER LPF
CRYSTALS: NONE SEEN
GLUCOSE, URINE: NEGATIVE MG/DL
KETONE, URINE: NEGATIVE MG/DL
LEUKOCYTE ESTERASE: NEGATIVE
NITRITE, URINE: NEGATIVE
PH URINE: 6 (ref 5.0–8.0)
PROTEIN, URINE: NEGATIVE MG/DL
SPECIFIC GRAVITY URINE: 1.02 (ref 1.003–1.035)
SQUAMOUS EPITHELIAL CELLS: >10 PER LPF — AB (ref 0–4)

## 2012-11-09 LAB — FREE THYROXINE: FREE THYROXINE: 1.35 ng/dL (ref 0.76–1.46)

## 2012-11-09 LAB — THYROID SCREEN TSH REFLEX FT4: THYROID SCREEN TSH REFLEX FT4: 0.009 u[IU]/mL — ABNORMAL LOW (ref 0.358–3.740)

## 2012-11-09 LAB — VITAMIN D,25 HYDROXY: VITAMIN D,25 HYDROXY: 19 ng/mL — CL (ref 30.0–100.0)

## 2012-11-09 NOTE — Progress Notes (Signed)
PRECEPTOR NOTE  On the day of the patient's visit,  I reviewed history and  Physical exam findings with the resident.  I agree with the assessment and plan as described below.  Please see resident's note for further details.

## 2012-11-09 NOTE — Progress Notes (Signed)
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 As Instructed Disp: 1 Device Rfl: 0   levothyroxine (SYNTHROID, LEVOTHROID) 100 MCG tablet Take 1 tablet by mouth daily. Disp: 30 tablet Rfl: 12   ibuprofen (ADVIL,MOTRIN) 800 MG tablet Take 1 tablet by mouth every 6 (six) hours as needed for Pain. Disp: 90 tablet Rfl: 0   SUMAtriptan (IMITREX) 100 MG tablet Take 1 tablet by mouth as needed for Migraine. as needed for migraine may repeat once after 2 hours if no relief Disp: 10 tablet Rfl: 0     No current facility-administered medications for this visit.    ALLERGIES:  Review of Patient's Allergies indicates:   Penicillins             Hives    VITALS:  BP 110/70   Pulse 62   Temp(Src) 97.7 F (36.5 C) (Temporal)   Wt 163 lb (73.936 kg)   BMI 25.52 kg/m2   SpO2 99%  Pain Score: 8 (8/10)    GEN: well-appearing, no acute respiratory distress  HEENT: normocephalic, atraumatic  PSYCH: appropriate behavior, cooperative with exam. Mood is good, full congruent affect. Appropriate thought content. Organized thought process. 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  Plan:   COLLECTION VENOUS BLOOD VENIPUNCTURE, THYROID SCREEN TSH REFLEX FT4, CBC + PLT, VITAMIN D,25 HYDROXY  - REFERRAL TO ADULT PSYCHIATRY ( INT): [facility]  - telephone f/u with results  - RN outreach in 1 week   - f/u in 1 month    (788.1) Dysuria  Comment: r/o UTI, stone.   Plan: URINE DIP (POINT OF CARE), URINE CULTURE, LAB         ADD ON/WRITE IN TEST    (V04.81) Need for influenza vaccination  Plan: INFLUENZA VIRUS QUAD PRESV FREE VACCINE 3/> YRS        IM (PUBLIC)        Dispostion:  Return in 1 Months for DEPRESSION.        *  AVS printed

## 2012-11-10 LAB — URINE CULTURE: URINE CULTURE/COLONY COUNT: NO GROWTH

## 2012-11-11 ENCOUNTER — Other Ambulatory Visit (HOSPITAL_BASED_OUTPATIENT_CLINIC_OR_DEPARTMENT_OTHER): Payer: Self-pay | Admitting: Social Worker

## 2012-11-11 NOTE — Telephone Encounter (Signed)
Thank you for your referral to Adult Outpatient Psychiatry ([facility]).  We will be contacting your patient within the next 24 hours. Due to limited capacity in the adult [facility]PD, we cannot guarantee an available clinician at this time.  We will discuss with your patient their symptoms and needs and provide the patient with an appointment in our clinic if available or with resources for services elsewhere in their community.

## 2012-11-16 ENCOUNTER — Other Ambulatory Visit (HOSPITAL_BASED_OUTPATIENT_CLINIC_OR_DEPARTMENT_OTHER): Payer: Self-pay | Admitting: Clinical

## 2012-12-02 ENCOUNTER — Ambulatory Visit (HOSPITAL_BASED_OUTPATIENT_CLINIC_OR_DEPARTMENT_OTHER): Payer: Commercial Managed Care - PPO | Admitting: Family Medicine

## 2012-12-02 ENCOUNTER — Other Ambulatory Visit (HOSPITAL_BASED_OUTPATIENT_CLINIC_OR_DEPARTMENT_OTHER): Payer: Self-pay | Admitting: Family Medicine

## 2012-12-02 ENCOUNTER — Encounter (HOSPITAL_BASED_OUTPATIENT_CLINIC_OR_DEPARTMENT_OTHER): Payer: Self-pay | Admitting: Family Medicine

## 2012-12-02 VITALS — BP 90/60 | HR 68 | Temp 97.9°F | Wt 162.0 lb

## 2012-12-02 DIAGNOSIS — F320 Major depressive disorder, single episode, mild: Principal | ICD-10-CM

## 2012-12-02 DIAGNOSIS — E039 Hypothyroidism, unspecified: Secondary | ICD-10-CM

## 2012-12-02 DIAGNOSIS — F4322 Adjustment disorder with anxiety: Secondary | ICD-10-CM

## 2012-12-02 DIAGNOSIS — G44209 Tension-type headache, unspecified, not intractable: Secondary | ICD-10-CM

## 2012-12-02 DIAGNOSIS — E559 Vitamin D deficiency, unspecified: Secondary | ICD-10-CM

## 2012-12-02 LAB — THYROID SCREEN TSH REFLEX FT4: THYROID SCREEN TSH REFLEX FT4: 0.027 u[IU]/mL — ABNORMAL LOW (ref 0.358–3.740)

## 2012-12-02 LAB — FREE THYROXINE: FREE THYROXINE: 1.51 ng/dL — ABNORMAL HIGH (ref 0.76–1.46)

## 2012-12-02 LAB — VITAMIN B12: VITAMIN B12: 638 pg/mL (ref 193–986)

## 2012-12-02 MED ORDER — SERTRALINE HCL 25 MG PO TABS
ORAL_TABLET | ORAL | Status: AC
Start: 2012-12-02 — End: 2013-01-02

## 2012-12-02 MED ORDER — NAPROXEN 500 MG PO TABS
500.0000 mg | ORAL_TABLET | Freq: Two times a day (BID) | ORAL | Status: DC | PRN
Start: 2012-12-02 — End: 2014-03-15

## 2012-12-02 MED ORDER — ERGOCALCIFEROL 1.25 MG (50000 UT) PO CAPS
50000.00 [IU] | ORAL_CAPSULE | ORAL | Status: AC
Start: 2012-12-02 — End: 2012-12-14

## 2012-12-02 NOTE — Progress Notes (Signed)
error 

## 2012-12-03 ENCOUNTER — Encounter (HOSPITAL_BASED_OUTPATIENT_CLINIC_OR_DEPARTMENT_OTHER): Payer: Self-pay | Admitting: Family Medicine

## 2012-12-03 MED ORDER — LEVOTHYROXINE SODIUM 75 MCG PO TABS
75.0000 ug | ORAL_TABLET | Freq: Every day | ORAL | Status: DC
Start: 2012-12-03 — End: 2013-01-20

## 2012-12-05 ENCOUNTER — Ambulatory Visit (HOSPITAL_BASED_OUTPATIENT_CLINIC_OR_DEPARTMENT_OTHER): Payer: Self-pay

## 2012-12-05 DIAGNOSIS — G4730 Sleep apnea, unspecified: Principal | ICD-10-CM

## 2012-12-08 ENCOUNTER — Encounter (HOSPITAL_BASED_OUTPATIENT_CLINIC_OR_DEPARTMENT_OTHER): Payer: Self-pay | Admitting: Family Medicine

## 2012-12-08 DIAGNOSIS — F4322 Adjustment disorder with anxiety: Secondary | ICD-10-CM | POA: Insufficient documentation

## 2012-12-08 NOTE — Telephone Encounter (Signed)
Thank you for your referral to the Adult Outpatient Psychiatry. Due to our limited capacity we were not able to schedule an appointment at this time at the [place] Team, your patient was placed on a wait list and will be contacted as soon as we have availability.        Brenda Braun

## 2012-12-08 NOTE — Telephone Encounter (Signed)
Thank you for your referral to the Adult Outpatient Psychiatry. Tried to contact patient today and a message was left for her/him to contact the [place] team at [phone].          Brenda Braun

## 2012-12-17 NOTE — Procedures (Signed)
Date of Service: 12/05/2012    NOCTURNAL POLYSOMNOGRAPHY REPORT    INDICATION:  Daytime somnolence.    SUMMARY:  Sleep study data may be viewed in the media section of the EPIC medical record.    The Epworth sleepiness scale is moderately suggestive of sleep apnea scoring 12 out of a possible 24 total points.  The patient answers positively to approximately 1/3 of questions on our sleep symptomatology quiz, including having kicking feet during the night, having trouble staying asleep, and having difficulty breathing when she has a cold.    The patient lists Synthroid and Zoloft among medications.  These could affect sleep architecture.  The BMI of 26 places the patient in the category of being overweight.  The neck circumference was not recorded.  There is no history of hypertension.    Overnight polysomnography was recorded for a total time of 463 minutes.  Latency to sleep onset was normal at 11 minutes, with Latency to REM onset slightly decreased at 62 minutes.  Overall Sleep efficiency was relatively normal at 82%.  There was a modest increase in stage 1 and 2 light sleep at the expense of stage 3/4 slow wave sleep, and significant reduction in stage REM sleep to approximately 2/3 of the expected amount.    There were a total of 38 arousals with respiratory events.  The apnea-hypopnea index was normal at 1.  The respiratory disturbance index is essentially normal at 7.  Their only findings were hypopneas.  The mean arterial oxygen saturation during the night with 94%, ranging from 87-98%.  There was only 1 event where the saturation was below 90%, corresponding to less than 1 minute with the saturation.    The mean heart rate during the night was 58 beats per minute, ranging from 48-83 beats per minute.  There were episodes of sinus pause in PVCs recorded by our therapist.  The patient had minimal snoring during the night.  There was no bruxism.    The PLMS index was elevated at 20 events per  hour.    IMPRESSION:  No significant sleep apnea detected.  There were varying cardiac arrhythmias during the night, which should be investigated further.  There is evidence of periodic leg movements, suggesting restless leg syndrome, which should be evaluated as well.    ___________________________  Reviewed and Electronically Signed By: Drema Halon MD  Sig Date: 12/18/2012  Sig Time: 17:53:38  Dictated By: Drema Halon MD  Dict Date: 12/17/2012 Dict Time: 03 01 PM    Dictation Date and Time:12/17/2012 15:01:17  Transcription Date and Time:12/17/2012 15:21:38  eScription Dictation id: [id number] Confirmation # :[id number]      cc: Babs Sciara MD; Willis Modena MD

## 2012-12-28 ENCOUNTER — Ambulatory Visit (HOSPITAL_BASED_OUTPATIENT_CLINIC_OR_DEPARTMENT_OTHER): Payer: Commercial Managed Care - PPO

## 2013-01-12 ENCOUNTER — Other Ambulatory Visit (HOSPITAL_BASED_OUTPATIENT_CLINIC_OR_DEPARTMENT_OTHER): Payer: Self-pay | Admitting: Clinical

## 2013-01-14 ENCOUNTER — Ambulatory Visit (HOSPITAL_BASED_OUTPATIENT_CLINIC_OR_DEPARTMENT_OTHER): Payer: Commercial Managed Care - PPO | Admitting: Family Medicine

## 2013-01-19 ENCOUNTER — Ambulatory Visit (HOSPITAL_BASED_OUTPATIENT_CLINIC_OR_DEPARTMENT_OTHER): Payer: Commercial Managed Care - PPO | Admitting: Family Medicine

## 2013-01-19 ENCOUNTER — Encounter (HOSPITAL_BASED_OUTPATIENT_CLINIC_OR_DEPARTMENT_OTHER): Payer: Self-pay | Admitting: Family Medicine

## 2013-01-19 ENCOUNTER — Other Ambulatory Visit (HOSPITAL_BASED_OUTPATIENT_CLINIC_OR_DEPARTMENT_OTHER): Payer: Self-pay | Admitting: Family Medicine

## 2013-01-19 VITALS — BP 100/60 | HR 67 | Temp 98.2°F | Wt 174.4 lb

## 2013-01-19 DIAGNOSIS — F4322 Adjustment disorder with anxiety: Principal | ICD-10-CM

## 2013-01-19 DIAGNOSIS — G44209 Tension-type headache, unspecified, not intractable: Secondary | ICD-10-CM

## 2013-01-19 DIAGNOSIS — G2581 Restless legs syndrome: Secondary | ICD-10-CM

## 2013-01-19 DIAGNOSIS — Z23 Encounter for immunization: Secondary | ICD-10-CM

## 2013-01-19 DIAGNOSIS — E039 Hypothyroidism, unspecified: Secondary | ICD-10-CM

## 2013-01-19 DIAGNOSIS — E663 Overweight: Secondary | ICD-10-CM

## 2013-01-19 DIAGNOSIS — K5904 Chronic idiopathic constipation: Secondary | ICD-10-CM

## 2013-01-19 DIAGNOSIS — E559 Vitamin D deficiency, unspecified: Secondary | ICD-10-CM

## 2013-01-19 DIAGNOSIS — IMO0002 Reserved for concepts with insufficient information to code with codable children: Secondary | ICD-10-CM

## 2013-01-19 DIAGNOSIS — Z6825 Body mass index (BMI) 25.0-25.9, adult: Secondary | ICD-10-CM

## 2013-01-19 DIAGNOSIS — F320 Major depressive disorder, single episode, mild: Secondary | ICD-10-CM

## 2013-01-19 MED ORDER — SERTRALINE HCL 25 MG PO TABS
25.0000 mg | ORAL_TABLET | Freq: Every day | ORAL | Status: DC
Start: 2013-01-19 — End: 2013-11-08

## 2013-01-19 MED ORDER — DOCUSATE SODIUM 100 MG PO CAPS
100.00 mg | ORAL_CAPSULE | Freq: Two times a day (BID) | ORAL | Status: AC
Start: 2013-01-19 — End: 2013-02-18

## 2013-01-19 MED ORDER — POLYETHYLENE GLYCOL 3350 17 G PO PACK
17.00 g | PACK | Freq: Every day | ORAL | Status: AC
Start: 2013-01-19 — End: 2013-02-18

## 2013-01-19 NOTE — Progress Notes (Signed)
01/19/13  VIS given prior to administration and reviewed with the patient and or legal guardian. Patient understands the disease and the vaccine. See immunization/Injection module or chart review for date of publication and additional information.  Basilia Jumbo, LPN

## 2013-01-19 NOTE — Progress Notes (Signed)
PRECEPTOR NOTE:  On the day of the patient's visit, I discussed the key elements of history and physical exam and I reviewed the findings with the resident.  I agree with the assessment and plan as described in their documentation.  Please see resident's note for further details.

## 2013-01-19 NOTE — Progress Notes (Signed)
SUBJECTIVE:  Brenda Braun is a 32 year old female who presents for f/u of anxiety.     With the following concern(s):    #) Anxiety  On sertraline 50 mg nightly   Feeling much better  Has gained weight since her last visit -- is wondering if it's related to the SSRI  Is eating "right"  Doesn't want to be on a medication long term- for 5-6 years at a time    #) Weight gain  Has tried Sibutramine in the past- is wondering whether she should take this again  Diet-- not stress eating   Exercise-- interested in losing weight and developing an exercise prescription plan-- none currently  BMs- stooling every 3 days, very painful and hard stool-- no worse than her baseline    #) Hypothyroidism  On 100 mcg levo-- never got the message to decrease to 75 mcg based on last month's TSH testing     #) Constipation  BMs- stooling every 3 days, very painful and hard stool-- no worse than her baseline  Occasionally will see blood on the toilet paper after wiping  Stomach feels more distended, gets full faster than normal  No nausea, vomiting, reflux, odynophagia or dysphagia  No abdominal pain or melena    ROS:  Gen: denies fever, chills, appetite loss, unintentional weight loss, malaise  Pulm: denies cough, shortness of breath, pleuritic pain  Cardio: denies chest pain, palpitations, orthopnea, paroxysmal nocturnal dyspnea, interval leg swelling  Abd: denies abdominal pain, nausea, vomiting, dysphagia, constipation, or diarrhea  Neuro: denies headache, focal numbness/tingling, weakness, falls    PROBLEM LIST:  Patient Active Problem List:     Unspecified hypothyroidism     Surveillance of previously prescribed intrauterine contraceptive device     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 Disp: 12 capsule Rfl:  0   sertraline (ZOLOFT) 25 MG tablet Take once a day for 1 week. Increase to 2 tabs daily in 1 week as tolerated. Disp: 50 tablet Rfl: 1   naproxen (NAPROSYN) 500 MG tablet Take 1 tablet by mouth 2 (two) times daily as needed for Pain. Limit to 3 times per week Disp: 30 tablet Rfl: 0   levonorgestrel (MIRENA) 20 MCG/24HR IUD 1 each by Intrauterine route continuous. As Instructed Disp: 1 Device Rfl: 0   ibuprofen (ADVIL,MOTRIN) 800 MG tablet Take 1 tablet by mouth every 6 (six) hours as needed for Pain. Disp: 90 tablet Rfl: 0   SUMAtriptan (IMITREX) 100 MG tablet Take 1 tablet by mouth as needed for Migraine. as needed for migraine may repeat once after 2 hours if no relief Disp: 10 tablet Rfl: 0     No current facility-administered medications for this visit.    ALLERGIES:  Review of Patient's Allergies indicates:   Penicillins             Hives    VITALS:  BP 100/60   Pulse 67   Temp(Src) 98.2 F (36.8 C) (Temporal)   Wt 174 lb 6.4 oz (79.107 kg)   BMI 27.31 kg/m2   SpO2 99%  Pain Score: 0 (0/10)    Most Recent Weight Reading(s)  01/19/13 : 174 lb 6.4 oz (79.107 kg)  12/02/12 : 162 lb (73.483 kg)  11/09/12 : 163 lb (73.936 kg)  06/17/12 : 165 lb (74.844 kg)  03/05/12 : 154 lb (69.854 kg)    GEN: well-appearing female in no acute respiratory distress  CARDIO: RRR no MRG  PULM: CTA b/l  ABD: lap cholecystectomy scars. Normoactive bowel sounds. Abdomen is soft, NTND without masses or organomegaly  PSYCH: appropriate behavior, cooperative with exam. Mood is good, full congruent affect. Appropriate thought content. Organized thought process. Good insight and judgement    PHQ9 = 8  GAD-7 = 5    THYROID SCREEN TSH REFLEX FT4 (uIU/mL)   Date  Value    12/02/2012  0.027*    11/09/2012  0.009*    02/20/2012  2.82    ----------    Vit D (10/2012): 16    A/P:  Brenda Braun is a 32 year old female who presents with:    (278.02,  V85.21) Overweight (BMI 25.0-29.9)  (309.24) Adjustment disorder with anxiety  (primary encounter  diagnosis)  (296.21) Mild major depression, single episode  Comment: 32 yo F who is now struggling with low grade depression and anxiety first triggered by the death of her father 10 months ago. Her irritability, quickness to anger and anxiety have improved with the addn of 25 mg of sertraline nightly 6 weeks ago (? placebo), but she continues to struggle with weight gain and feelings of sadness. She is interested in exercising to improve her mood, help w weight and constipation.   Plan:   - continue sertraline (ZOLOFT) 25 MG tablet nightly  - intake to the [place] therapy team is pending-- waitlisted  - REFERRAL TO SPORTS MEDICINE - FAMILY MEDICINE (INT)    (244.9) Unspecified hypothyroidism  Comment: TSH suppressed x 2 in September and again in October, concerning for over-correction of hypothyroidism with HRT. I had instructed to decrease the dose of levothyroxine from 100 mg to 75 mg at the last visit, but she never got the message and has continued her replacement dose of 100 mg. 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 Due for recheck today  Plan: VITAMIN D,25 HYDROXY    (V06.1) Need for Tdap vaccination  Plan: TDAP VACCINE 7 AND OLDER IM (PUBLIC)    Dispostion:  Return in 4-6 weeks for f/u mood.        *AVS printed  *Will call patient for results, okay to leave message

## 2013-01-20 DIAGNOSIS — E559 Vitamin D deficiency, unspecified: Secondary | ICD-10-CM | POA: Insufficient documentation

## 2013-01-20 DIAGNOSIS — E663 Overweight: Secondary | ICD-10-CM | POA: Insufficient documentation

## 2013-01-20 DIAGNOSIS — IMO0002 Reserved for concepts with insufficient information to code with codable children: Secondary | ICD-10-CM

## 2013-01-20 LAB — FERRITIN: FERRITIN: 59 ng/mL (ref 8–252)

## 2013-01-20 LAB — TSH (THYROID STIMULATING HORMONE): TSH (THYROID STIM HORMONE): 2.79 u[IU]/mL (ref 0.358–3.740)

## 2013-01-20 LAB — VITAMIN D,25 HYDROXY: VITAMIN D,25 HYDROXY: 32 ng/mL (ref 30.0–100.0)

## 2013-01-20 MED ORDER — LEVOTHYROXINE SODIUM 100 MCG PO TABS
100.0000 ug | ORAL_TABLET | Freq: Every day | ORAL | Status: DC
Start: 2013-01-20 — End: 2013-12-30

## 2013-01-20 MED ORDER — VITAMIN D 50 MCG (2000 UT) PO TABS
1.00 | ORAL_TABLET | Freq: Every day | ORAL | Status: AC
Start: 2013-01-20 — End: 2013-11-08

## 2013-01-20 NOTE — Progress Notes (Addendum)
ADDENDUM:     Hypothyroidism: TSH within normal limits. Continue 100 mcg levothyroxine.    Vitamin D deficiency: vit D > 30. Continue 2000 IU cholecalciferol.    Restless leg movements noted on polysomnography testing-- will add on ferritin to r/o underlying iron deficiency.    Kirkland Figg MD, 01/20/2013, 4:59 PM

## 2013-01-20 NOTE — Addendum Note (Signed)
Addended by: Willis Modena on: 01/20/2013 05:17 PM     Modules accepted: Orders, Medications

## 2013-01-29 ENCOUNTER — Encounter (HOSPITAL_BASED_OUTPATIENT_CLINIC_OR_DEPARTMENT_OTHER): Payer: Self-pay | Admitting: Family Medicine

## 2013-01-29 ENCOUNTER — Ambulatory Visit (HOSPITAL_BASED_OUTPATIENT_CLINIC_OR_DEPARTMENT_OTHER): Payer: Commercial Managed Care - PPO | Admitting: Family Medicine

## 2013-01-29 VITALS — BP 102/62 | HR 65 | Temp 97.6°F | Ht 64.96 in | Wt 175.0 lb

## 2013-01-29 DIAGNOSIS — Z6825 Body mass index (BMI) 25.0-25.9, adult: Secondary | ICD-10-CM

## 2013-01-29 DIAGNOSIS — Z7182 Exercise counseling: Principal | ICD-10-CM

## 2013-01-29 DIAGNOSIS — IMO0002 Reserved for concepts with insufficient information to code with codable children: Secondary | ICD-10-CM

## 2013-01-29 NOTE — Progress Notes (Signed)
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 Disp: 30 tablet Rfl: 5   cholecalciferol (VITAMIN D3) 2000 UNIT tablet Take 1 tablet by mouth daily. Disp: 30 tablet Rfl: 11   sertraline (ZOLOFT) 25 MG tablet Take 1 tablet by mouth daily. Disp: 30 tablet Rfl: 3   docusate sodium (COLACE) 100 MG capsule Take 1 capsule by mouth 2 (two) times daily. Disp: 60 capsule Rfl: 11   polyethylene glycol (GLYCOLAX/MIRALAX) packet Take 1 packet by mouth daily. Disp: 1 Package Rfl: 11   naproxen (NAPROSYN) 500 MG tablet Take 1 tablet by mouth 2 (two) times daily as needed for Pain. Limit to 3 times per week Disp: 30 tablet Rfl: 0   levonorgestrel (MIRENA) 20 MCG/24HR IUD 1 each by Intrauterine route continuous. As Instructed Disp: 1 Device Rfl: 0   ibuprofen (ADVIL,MOTRIN) 800 MG tablet Take 1 tablet by mouth every 6 (six) hours as needed for Pain. Disp: 90 tablet Rfl: 0   SUMAtriptan (IMITREX) 100 MG tablet Take 1 tablet by mouth as needed for Migraine. as needed for migraine may repeat once after 2 hours if no relief  Disp: 10 tablet Rfl: 0     No current facility-administered medications on file prior to visit.  Review of Patient's Allergies indicates:   Penicillins             Hives   01/29/13  1003   BP: 102/62   Pulse: 65   Temp: 97.6 F (36.4 C)   TempSrc: Temporal   Height: 5' 4.96" (1.65 m)   Weight: 175 lb (79.379 kg)   SpO2: 98%             Physical Exam   Constitutional: She is oriented to person, place, and time. She appears well-developed and well-nourished.   HENT:   Head: Normocephalic and atraumatic.   Neck: Normal range of motion.   Pulmonary/Chest: Effort normal.   Abdominal: Soft.   Musculoskeletal: Normal range of motion.   Neurological: She is alert and oriented to person, place, and time.   Skin: Skin is warm and dry.   Psychiatric: She has a normal mood and affect. Her behavior is normal. Judgment and thought content normal.       More than 50% of visit was made with counseling    (  V65.41) Exercise counseling  (primary encounter diagnosis)  Comment: Discussed with patient how exercise is medicine and the benefits to exercising that help with anxiety and increase energy levels. Discussed that this is a small piece to any weight loss plan as exercise at the minimum levels only maintains weight while eating better and less is the other part of the process.  Plan: Discussed slowly working up each week for 8 weeks to 150 minutes per week then follow up to discuss progress. If doing well meeting aerobic base add in strengthening activity. Advised to keep diet and exercise diary and bring next visit.    (278.02,  V85.21) Overweight (BMI 25.0-29.9)  Comment: Discussed diet and exercise goals  Plan: refer to nutrition to work with food plan.    Ellie Lunch DO

## 2013-02-09 NOTE — Telephone Encounter (Signed)
Message was left for patient to call back [phone] to schedule Intake appointment.

## 2013-03-02 ENCOUNTER — Ambulatory Visit (HOSPITAL_BASED_OUTPATIENT_CLINIC_OR_DEPARTMENT_OTHER): Payer: Commercial Managed Care - PPO | Admitting: Family Medicine

## 2013-03-26 ENCOUNTER — Ambulatory Visit (HOSPITAL_BASED_OUTPATIENT_CLINIC_OR_DEPARTMENT_OTHER): Payer: Commercial Managed Care - PPO | Admitting: Family Medicine

## 2013-04-01 ENCOUNTER — Ambulatory Visit (HOSPITAL_BASED_OUTPATIENT_CLINIC_OR_DEPARTMENT_OTHER): Payer: Commercial Managed Care - PPO | Admitting: Registered"

## 2013-04-01 ENCOUNTER — Ambulatory Visit (HOSPITAL_BASED_OUTPATIENT_CLINIC_OR_DEPARTMENT_OTHER): Payer: Commercial Managed Care - PPO

## 2013-06-30 ENCOUNTER — Other Ambulatory Visit (HOSPITAL_BASED_OUTPATIENT_CLINIC_OR_DEPARTMENT_OTHER): Payer: Self-pay

## 2013-06-30 NOTE — Telephone Encounter (Signed)
Outreach: pt overdure for Physical and Pap Smear Exams, unable to reach patient, Left a message on voicemail.

## 2013-08-18 ENCOUNTER — Encounter (HOSPITAL_BASED_OUTPATIENT_CLINIC_OR_DEPARTMENT_OTHER): Payer: Self-pay | Admitting: Family Medicine

## 2013-08-24 ENCOUNTER — Ambulatory Visit (HOSPITAL_BASED_OUTPATIENT_CLINIC_OR_DEPARTMENT_OTHER): Payer: Commercial Managed Care - PPO

## 2013-08-24 ENCOUNTER — Encounter (HOSPITAL_BASED_OUTPATIENT_CLINIC_OR_DEPARTMENT_OTHER): Payer: Self-pay

## 2013-08-24 VITALS — BP 98/50 | HR 64 | Temp 97.7°F | Wt 177.0 lb

## 2013-08-24 DIAGNOSIS — R5383 Other fatigue: Secondary | ICD-10-CM

## 2013-08-24 DIAGNOSIS — N644 Mastodynia: Secondary | ICD-10-CM

## 2013-08-24 DIAGNOSIS — R635 Abnormal weight gain: Secondary | ICD-10-CM

## 2013-08-24 DIAGNOSIS — R5381 Other malaise: Secondary | ICD-10-CM

## 2013-08-24 DIAGNOSIS — R52 Pain, unspecified: Secondary | ICD-10-CM

## 2013-08-24 DIAGNOSIS — E039 Hypothyroidism, unspecified: Principal | ICD-10-CM

## 2013-08-24 LAB — CBC WITH PLATELET
HEMATOCRIT: 40 % (ref 34.1–44.9)
HEMOGLOBIN: 14 g/dL (ref 11.2–15.7)
MEAN CORP HGB CONC: 35 g/dL (ref 31.0–37.0)
MEAN CORPUSCULAR HGB: 29.5 pg (ref 26.0–34.0)
MEAN CORPUSCULAR VOL: 84.4 fL (ref 80.0–100.0)
MEAN PLATELET VOLUME: 10.7 fL (ref 8.7–12.5)
PLATELET COUNT: 226 10*3/uL (ref 150–400)
RBC DISTRIBUTION WIDTH STD DEV: 41.7 fL (ref 35.1–46.3)
RBC DISTRIBUTION WIDTH: 13.5 % (ref 11.5–14.3)
RED BLOOD CELL COUNT: 4.74 M/uL (ref 3.90–5.20)
WHITE BLOOD CELL COUNT: 5.3 10*3/uL (ref 4.0–11.0)

## 2013-08-24 LAB — VITAMIN D,25 HYDROXY: VITAMIN D,25 HYDROXY: 11 ng/mL — CL (ref 30.0–100.0)

## 2013-08-24 LAB — THYROID SCREEN TSH REFLEX FT4: THYROID SCREEN TSH REFLEX FT4: 2.66 u[IU]/mL (ref 0.358–3.740)

## 2013-08-24 NOTE — Progress Notes (Signed)
CC:  Patient presents with:     SUBJECTIVE  Brenda Braun is a 33 year old female     With the following problem list  Patient Active Problem List:     Unspecified hypothyroidism     Surveillance of previously prescribed intrauterine contraceptive device     Pap smear abnormality of cervix with ASCUS favoring benign     Sleepwalking disorder     Adjustment disorder with anxiety     Overweight (BMI 25.0-29.9)     Vitamin D deficiency    Due to the language barrier, the office visit was conducted in [place] with an interpreter.  The interpreter was on the phone during the entire visit.    Who presents for the following today:    1) Weight Gain  -has been gaining a lot of weight  -not had this problem in the past   -has been taking Levothyroxine 100 mcg daily  -also feels very tired   -having low energy   -stopped taking Zoloft 2 months ago    2) Left Breast Pain  -started 2 months ago   -pain comes and goes   -last week had " a lot of pain"  -pain located sternal area of breast  -feels like someone is "pinching me"  -no aggravating factors  -no alleviating factors  -no breast discharge  -no associated shortness of breath  -sometimes gets palpitations     -had breast augmentation about 6 years ago         Medications:  Current outpatient prescriptions:levothyroxine (SYNTHROID, LEVOTHROID) 100 MCG tablet, Take 1 tablet by mouth daily., Disp: 30 tablet, Rfl: 5;  cholecalciferol (VITAMIN D3) 2000 UNIT tablet, Take 1 tablet by mouth daily., Disp: 30 tablet, Rfl: 11;  sertraline (ZOLOFT) 25 MG tablet, Take 1 tablet by mouth daily., Disp: 30 tablet, Rfl: 3  naproxen (NAPROSYN) 500 MG tablet, Take 1 tablet by mouth 2 (two) times daily as needed for Pain. Limit to 3 times per week, Disp: 30 tablet, Rfl: 0;  levonorgestrel (MIRENA) 20 MCG/24HR IUD, 1 each by Intrauterine route continuous. As Instructed, Disp: 1 Device, Rfl: 0;  ibuprofen (ADVIL,MOTRIN) 800 MG tablet, Take 1 tablet by mouth every 6 (six) hours as needed  for Pain., Disp: 90 tablet, Rfl: 0  SUMAtriptan (IMITREX) 100 MG tablet, Take 1 tablet by mouth as needed for Migraine. as needed for migraine may repeat once after 2 hours if no relief, Disp: 10 tablet, Rfl: 0      Allergies:  Review of Patient's Allergies indicates:   Penicillins             Hives        OBJECTIVE    PHYSICAL EXAM:  Vital Signs  BP 98/50   Pulse 64   Temp(Src) 97.7 F (36.5 C) (Temporal)   Wt 177 lb (80.287 kg)   SpO2 98%     Most Recent Weight Reading(s)  08/24/13 : 177 lb (80.287 kg)  01/29/13 : 175 lb (79.379 kg)  01/19/13 : 174 lb 6.4 oz (79.107 kg)  12/02/12 : 162 lb (73.483 kg)  11/09/12 : 163 lb (73.936 kg)        Physical Exam   Constitutional: She appears well-developed and well-nourished. She is cooperative. No distress.   Eyes: EOM are normal. Pupils are equal, round, and reactive to light.   Neck: No thyroid mass and no thyromegaly present.   Cardiovascular: Normal rate, regular rhythm, S1 normal and S2 normal.  Exam reveals  no gallop.    No murmur heard.  Pulses:       Radial pulses are 2+ on the right side, and 2+ on the left side.   Pulmonary/Chest: Effort normal. She has no decreased breath sounds. She has no wheezes. She has no rhonchi. She has no rales. Right breast exhibits no inverted nipple, no mass, no nipple discharge, no skin change and no tenderness. Left breast exhibits tenderness. Left breast exhibits no inverted nipple, no mass, no nipple discharge and no skin change. Breasts are symmetrical.   Left breast with fibrocystic changes    Lymphadenopathy:        Right cervical: No superficial cervical adenopathy present.       Left cervical: No superficial cervical adenopathy present.     She has no axillary adenopathy.        Right: No supraclavicular adenopathy present.        Left: No supraclavicular adenopathy present.   Psychiatric: She has a normal mood and affect. Her speech is normal and behavior is normal.     ASSESSMENT/PLAN  1. Unspecified hypothyroidism  -given  new symptoms, appropriate to check TSH  - Thyroid Screen TSH Reflex FT4    2. Breast pain, left  -likely secondary to fibrocystic changes  -if symptoms persist or worsen, return to care and consider breast US     3. Body aches  4. Malaise and fatigue  5. Weight gain  -check CBC,Vitamin D  -there may be an underlying component of anxiety, and patient discontinued SSRI  -stress relief encouraged   -consider trial of SNRI or TCA for symptoms    -patient is going to [place] for 1.5 months  -she will return with any concerns once she returns       I have spent 25 minutes in face to face time with this patient/patient proxy of which > 50% was in counseling or coordination of care regarding above issues/Dx.          1. The patient indicates understanding of these issues and agrees with the plan.  2.  The patient is given an After Visit Summary sheet that lists all of their medications with directions, their allergies, orders placed during this encounter, immunization dates, and follow- up instructions.  3. I reviewed the patient's medical information and medical history   4.  I reconciled the patient's medication list and prepared and supplied needed refills.  5.  I have reviewed the past medical, family, and social history sections including the medications and allergies listed in the above medical record  Armany Mano PA-C, 08/24/2013, 2:22 PM

## 2013-08-27 ENCOUNTER — Other Ambulatory Visit (HOSPITAL_BASED_OUTPATIENT_CLINIC_OR_DEPARTMENT_OTHER): Payer: Self-pay

## 2013-08-27 ENCOUNTER — Encounter (HOSPITAL_BASED_OUTPATIENT_CLINIC_OR_DEPARTMENT_OTHER): Payer: Self-pay

## 2013-08-27 DIAGNOSIS — E559 Vitamin D deficiency, unspecified: Principal | ICD-10-CM

## 2013-08-27 MED ORDER — ERGOCALCIFEROL 1.25 MG (50000 UT) PO CAPS
50000.00 [IU] | ORAL_CAPSULE | ORAL | Status: AC
Start: 2013-08-27 — End: 2013-10-30

## 2013-09-08 ENCOUNTER — Other Ambulatory Visit (HOSPITAL_BASED_OUTPATIENT_CLINIC_OR_DEPARTMENT_OTHER): Payer: Self-pay

## 2013-09-08 NOTE — Telephone Encounter (Signed)
Outreach: patient overdue for Physica/PapSmear Exams, Unabe to reach patient. unable to left a message on voicemail.

## 2013-11-08 ENCOUNTER — Encounter (HOSPITAL_BASED_OUTPATIENT_CLINIC_OR_DEPARTMENT_OTHER): Payer: Self-pay

## 2013-11-08 ENCOUNTER — Ambulatory Visit (HOSPITAL_BASED_OUTPATIENT_CLINIC_OR_DEPARTMENT_OTHER): Payer: Commercial Managed Care - PPO

## 2013-11-08 VITALS — BP 100/70 | HR 72 | Temp 97.6°F | Wt 178.0 lb

## 2013-11-08 DIAGNOSIS — F4322 Adjustment disorder with anxiety: Secondary | ICD-10-CM

## 2013-11-08 DIAGNOSIS — N6019 Diffuse cystic mastopathy of unspecified breast: Secondary | ICD-10-CM

## 2013-11-08 DIAGNOSIS — Z6825 Body mass index (BMI) 25.0-25.9, adult: Secondary | ICD-10-CM

## 2013-11-08 DIAGNOSIS — R1032 Left lower quadrant pain: Principal | ICD-10-CM

## 2013-11-08 DIAGNOSIS — IMO0002 Reserved for concepts with insufficient information to code with codable children: Secondary | ICD-10-CM

## 2013-11-08 DIAGNOSIS — Z30431 Encounter for routine checking of intrauterine contraceptive device: Secondary | ICD-10-CM

## 2013-11-08 DIAGNOSIS — E663 Overweight: Secondary | ICD-10-CM

## 2013-11-08 DIAGNOSIS — IMO0001 Reserved for inherently not codable concepts without codable children: Secondary | ICD-10-CM

## 2013-11-08 DIAGNOSIS — Z319 Encounter for procreative management, unspecified: Secondary | ICD-10-CM

## 2013-11-08 DIAGNOSIS — N898 Other specified noninflammatory disorders of vagina: Secondary | ICD-10-CM

## 2013-11-08 LAB — URINE PREGNANCY TEST (POINT OF CARE): HCG QUALITATIVE URINE: NEGATIVE

## 2013-11-08 MED ORDER — PRENATAL VITAMINS 28-0.8 MG PO TABS
1.0000 | ORAL_TABLET | Freq: Every day | ORAL | Status: DC
Start: 2013-11-08 — End: 2013-12-29

## 2013-11-08 MED ORDER — ERGOCALCIFEROL 1.25 MG (50000 UT) PO CAPS
50000.0000 [IU] | ORAL_CAPSULE | ORAL | Status: DC
Start: 2013-11-08 — End: 2013-12-29

## 2013-11-08 NOTE — Progress Notes (Signed)
[facility]    Subjective:   Brenda Braun is a 33 year old female who is here for:    Test results 08/2013:  - TSH WNL  - Vit D low  - CBC WNL    Lower abdominal pain:  - Started 08/2013  - L sided, rated   - Cramping pain with urination, walking upstairs, and with sex  - Lasts 10-15 minutes, relieved by tylenol/ibuprofen  - Associated with nausea,   - Denies diarrhea/constipation or blood in stool, has BM 1xday  - No menstruation with Mirena   - Has had thin whitish discharge x2 months  - No foul smell, itchiness  - In monogamous relationship    Wants IUD removed:  - Mirena placed 03/2012  - Wants to become pregnant    Breast exam:  - Historical provider thought she felt a nodule in L breast, 08/2013  - Pt has had no further work up  - Pt does not feel lump  - Denies skin changes and nipple discharge      ROS:  As per HPI    Patient Active Problem List:     Unspecified hypothyroidism     Surveillance of previously prescribed intrauterine contraceptive device     Pap smear abnormality of cervix with ASCUS favoring benign     Sleepwalking disorder     Adjustment disorder with anxiety     Overweight (BMI 25.0-29.9)     Vitamin D deficiency      MEDS:  Current outpatient prescriptions: levothyroxine (SYNTHROID, LEVOTHROID) 100 MCG tablet, Take 1 tablet by mouth daily., Disp: 30 tablet, Rfl: 5;  cholecalciferol (VITAMIN D3) 2000 UNIT tablet, Take 1 tablet by mouth daily., Disp: 30 tablet, Rfl: 11;  sertraline (ZOLOFT) 25 MG tablet, Take 1 tablet by mouth daily., Disp: 30 tablet, Rfl: 3  naproxen (NAPROSYN) 500 MG tablet, Take 1 tablet by mouth 2 (two) times daily as needed for Pain. Limit to 3 times per week, Disp: 30 tablet, Rfl: 0;  levonorgestrel (MIRENA) 20 MCG/24HR IUD, 1 each by Intrauterine route continuous. As Instructed, Disp: 1 Device, Rfl: 0;  ibuprofen (ADVIL,MOTRIN) 800 MG tablet, Take 1 tablet by mouth every 6 (six) hours as needed for Pain., Disp: 90 tablet, Rfl:  0  SUMAtriptan (IMITREX) 100 MG tablet, Take 1 tablet by mouth as needed for Migraine. as needed for migraine may repeat once after 2 hours if no relief, Disp: 10 tablet, Rfl: 0    Review of Patient's Allergies indicates:   Penicillins             Hives    Objective:  BP 100/70 mmHg   Pulse 72   Temp(Src) 97.6 F (36.4 C) (Temporal)   Wt 80.74 kg (178 lb)   SpO2 100%   Most Recent Weight Reading(s)  11/08/13 : 80.74 kg (178 lb)  08/24/13 : 80.287 kg (177 lb)  01/29/13 : 79.379 kg (175 lb)  01/19/13 : 79.107 kg (174 lb 6.4 oz)  12/02/12 : 73.483 kg (162 lb)  GEN:  Well appearing, NAD  PSYCH: Mood is fair, affect is full  HEENT:  PERRLA, sclera non-injected, supple and no adenopathy.  HEART:  RRR, no murmurs, rubs or gallops.  PULM: Lungs clear to auscultation bilaterally  BREASTS: Chaperoned by [facility].  Equal size. Nipples without retraction or inversion. Fibrocystic changes and implants palpated using concentric circle method b/l. No axillary LAD.  ABD: abdomen is soft with mild LLQ tenderness, no masses, organomegaly or  guarding  GYN: Exam chaperoned by . Normal labia.  Vagina with normal rugae.  Bartholin/Skein's glands normal.  Cervix visualized.  No unusual discharge/odor.  No cervical motion tenderness. Uterus anteverted with no adnexal fullness/tenderness.  EXT: WWP, no edema  NEURO: Gait normal.Moves all extremities.    Wet prep: pH 4.5, (neg)Whiff test, (neg)clue cells, (neg)pseudohyphae, (neg)trichomonas      Assessment and Plan:  Brenda Braun is a 33 year old female with    (789.04) Left lower quadrant pain  (primary encounter diagnosis)  Comment: Does not appear related to GI or urinary symptoms.  No ovarian masses palpated on bimanual exam.  Will r/o GCC infection.  Patient attributes pain to IUD which was removed on this visit.  Plan:   - AMPLIFIED GENPROBE CHLAM/GC  - F/u if pain persists    (623.5) Vaginal discharge  Comment: Appears normal on exam.  Wet prep WNL.  Will r/o GCC infection  Plan:    - AMPLIFIED GENPROBE CHLAM/GC  - Advised NTD and discouraged use of vaginal hygeine products including douching    (V25.42) Family planning, IUD (intrauterine device) check/reinsertion/removal  Comment: Desired removal.  POC urine pregnancy test neg prior to removal.  Counseled on high risk nature of future pregnancy given age and weight.  Plan:  - REMOVAL INTRAUTERINE DEVICE IUD  - PNV    (278.02) Overweight (BMI 25.0-29.9)  Comment: Pt reports gaining weight.  Has c/o of this in past.  Unable to address today given time constraint. TSH WNL.  Plan:   - F/u future visit for weight gain    (309.24) Adjustment disorder with anxiety  Comment: Likely underlies patient's many complaints.  Required much reassurance by me of normal findings.  Plan:   - F/u at future visit    (610.1) Fibrocystic breast changes, unspecified laterality  Comment: No abnormalities palpated.  Discussed increased risk of abnormal findings on mammogram given fibrocystic breasts and possible need for biopsy of normal tissue.  Plan:   - Monitor for changes, no mammogram at this time.    (V26.9) Desire for pregnancy  Comment: IUD removed as above  Plan:   - PNV      I have reviewed the past medical, surgical, social and family history and updated these sections of EpicCare as relevant. All interim labs, test results, and consult notes were reviewed and discussed with Brenda Braun. Medications were reconciled during this visit and a current medication list was given to the patient at the end of the visit.    We discussed the patients current medications. The patient expressed understanding and no barriers to adherence were identified.     For next time:   [  ] F/u weight gain    Discussed with Dr. Marylouise Stacks

## 2013-11-08 NOTE — Progress Notes (Signed)
PRECEPTOR NOTE:  On the day of the patient's visit, I discussed the key elements of history and physical exam and I reviewed the findings with the Resident.  I agree with the assessment and plan as described in their documentation.  Please see Resident's note for further details.

## 2013-11-09 ENCOUNTER — Encounter (HOSPITAL_BASED_OUTPATIENT_CLINIC_OR_DEPARTMENT_OTHER): Payer: Self-pay

## 2013-11-09 LAB — CHLAMYDIA GC NAAT
GENPROBE CHLAMYDIA: NEGATIVE
GENPROBE GC: NEGATIVE

## 2013-12-08 ENCOUNTER — Ambulatory Visit (HOSPITAL_BASED_OUTPATIENT_CLINIC_OR_DEPARTMENT_OTHER): Payer: Commercial Managed Care - PPO

## 2013-12-29 ENCOUNTER — Ambulatory Visit (HOSPITAL_BASED_OUTPATIENT_CLINIC_OR_DEPARTMENT_OTHER): Payer: Commercial Managed Care - PPO | Admitting: Family Medicine

## 2013-12-29 VITALS — BP 112/70 | HR 73 | Temp 97.3°F | Wt 173.0 lb

## 2013-12-29 DIAGNOSIS — Z319 Encounter for procreative management, unspecified: Secondary | ICD-10-CM

## 2013-12-29 DIAGNOSIS — E559 Vitamin D deficiency, unspecified: Secondary | ICD-10-CM

## 2013-12-29 DIAGNOSIS — R209 Unspecified disturbances of skin sensation: Secondary | ICD-10-CM

## 2013-12-29 DIAGNOSIS — R202 Paresthesia of skin: Principal | ICD-10-CM

## 2013-12-29 LAB — VITAMIN B12: VITAMIN B12: 424 pg/mL (ref 193–986)

## 2013-12-29 LAB — PROTHROMBIN TIME
INR: 1.1 (ref 2.0–3.5)
PROTHROMBIN TIME: 11.5 SECONDS (ref 9.6–12.3)

## 2013-12-29 LAB — CBC WITH PLATELET
HEMATOCRIT: 40.1 % (ref 34.1–44.9)
HEMOGLOBIN: 14.3 g/dL (ref 11.2–15.7)
MEAN CORP HGB CONC: 35.7 g/dL (ref 31.0–37.0)
MEAN CORPUSCULAR HGB: 30.2 pg (ref 26.0–34.0)
MEAN CORPUSCULAR VOL: 84.6 fL (ref 80.0–100.0)
MEAN PLATELET VOLUME: 10.8 fL (ref 8.7–12.5)
PLATELET COUNT: 256 10*3/uL (ref 150–400)
RBC DISTRIBUTION WIDTH STD DEV: 39.8 fL (ref 35.1–46.3)
RBC DISTRIBUTION WIDTH: 12.9 % (ref 11.5–14.3)
RED BLOOD CELL COUNT: 4.74 M/uL (ref 3.90–5.20)
WHITE BLOOD CELL COUNT: 6.2 10*3/uL (ref 4.0–11.0)

## 2013-12-29 LAB — URINE PREGNANCY TEST (POINT OF CARE): HCG QUALITATIVE URINE: NEGATIVE

## 2013-12-29 LAB — TSH (THYROID STIMULATING HORMONE): TSH (THYROID STIM HORMONE): 10.6 u[IU]/mL — ABNORMAL HIGH (ref 0.358–3.740)

## 2013-12-29 MED ORDER — VITAMIN D 50 MCG (2000 UT) PO TABS
1.00 | ORAL_TABLET | Freq: Every day | ORAL | Status: AC
Start: 2014-02-23 — End: 2015-02-23

## 2013-12-29 MED ORDER — ERGOCALCIFEROL 1.25 MG (50000 UT) PO CAPS
50000.0000 [IU] | ORAL_CAPSULE | ORAL | Status: AC
Start: 2013-12-29 — End: 2014-02-23

## 2013-12-29 MED ORDER — PRENATAL VITAMINS 28-0.8 MG PO TABS
1.0000 | ORAL_TABLET | Freq: Every day | ORAL | Status: DC
Start: 2013-12-29 — End: 2014-07-22

## 2013-12-29 NOTE — Progress Notes (Signed)
[facility]  Family Medicine Office Visit Note   Patient presents with:  Muscle Cramps: arms  Other: test for HCG    Subjective:   Brenda Braun is a 33 year old female patient who presents today for cramps in arms  Onset: two weeks  Timing: in the middle of the night; twice a night 1am and at 2am with numbness of the arms.  Duration: 5-10 minutes  Location: both arms -- doesn't matter which side she sleeps on. Sleeps on her belly  Character: numbness and tingling  Severity: Pain Score: 0 (0/10) /10 now, 10/10 at its worst, 0/10 at its best  Aggravated by: none  Alleviated by: massage   Improves with warmth  Associated with: weakness in the hands in the morning.    Interested in pregnancy - dizziness and heartburn recently  Morning nausea and breast tenderness.  Urine pregnancy test    Didn't get vitamin D or PNV scripts yet.    Patient Active Problem List:     Unspecified hypothyroidism     Surveillance of previously prescribed intrauterine contraceptive device     Pap smear abnormality of cervix with ASCUS favoring benign     Sleepwalking disorder     Adjustment disorder with anxiety     Overweight (BMI 25.0-29.9)     Vitamin D deficiency        Objective:   BP 112/70 mmHg   Pulse 73   Temp(Src) 97.3 F (36.3 C) (Temporal)   Wt 78.472 kg (173 lb)   SpO2 100%   LMP 11/25/2013  GA: Well developed [place] woman in no apparent distress   Skin: 3 small ecchymoses on the right antecubital fossa, 3mm in size.  HEENT: Normocephalic/atraumatic. Mucous membranes are moist. EOMI. Oropharynx clear.  Neck: supple, no lymphadenopathy. No thyromegaly.  Lung: clear to auscultation bilaterally, no crackles/wheezes/rhonchi  CV: regular rate and rhythm, no murmurs/rubs/gallops   ABD: soft, non-tender. normoactive bowel sounds. No organomegaly.   Extrem: warm, well perfused, 2+radial pulses . No cyanosis/clubbing/edema  Neuro: CN II-XII intact.  Strength 5/5 in UE/LE.  Reflexes 2+ in  biceps/triceps/brachioradialis/patella  Sensation: grossly intact.    Gait: normal swing/stance     CHOLESTEROL (mg/dl)   Date Value   96/04/540902/25/2013 131   ----------    LOW DENSITY LIPOPROTEIN DIRECT (mg/dl)   Date Value   81/19/147802/25/2013 74   ----------    HIGH DENSITY LIPOPROTEIN (mg/dl)   Date Value   29/56/213002/25/2013 38   ----------  No results found for: TG   Results for Brenda RootsFERNANDES, Zierra (MRN [id number]) as of 12/29/2013 17:14   Ref. Range 12/29/2013 16:47   HCG QUALITATIVE URINE No range found Negative          Assessment & Plan:   Brenda RootsDaniele Riemann is a 33 year old female with     (R20.2) Paresthesia of upper extremities  (primary encounter diagnosis)  Comment: nerve vs vascular compression vs intermittent neuropathy. will check routine labs; discussed pt positioning at nighttime to ameliorate her acute sx.  Plan: VITAMIN B12, TSH (THYROID STIMULATING HORMONE),        HEMOGLOBIN A1C, PROTEIN ELECTROPHORESIS, CBC         WITH PLATELET, PROTHROMBIN TIME            (Z31.9) Desire for pregnancy  Plan: URINE PREGNANCY TEST (POINT OF CARE), Prenatal         Vit-Fe Fumarate-FA (PRENATAL VITAMINS) 28-0.8         MG TABS,  COLLECTION VENOUS BLOOD VENIPUNCTURE    (E55.9) Vitamin D deficiency  Plan: ergocalciferol (VITAMIN D2) 50000 UNIT capsule x2 months script sent to pharmacy        cholecalciferol (VITAMIN D3) 2000 UNIT tablet thereafter, script given to fill later on    I have reviewed the past medical, surgical, social and family history and updated these sections of EpicCare as relevant. All interim labs, test results, and consult notes were reviewed and discussed with Brenda Rootsaniele Rozak. Medications were reconciled during this visit and a current medication list was given to the patient at the end of the visit.    We discussed the patients current medications. The patient expressed understanding and no barriers to adherence were identified .     Annina Piotrowski K. Kela MillinPong, MD  12/30/2013

## 2013-12-30 ENCOUNTER — Telehealth (HOSPITAL_BASED_OUTPATIENT_CLINIC_OR_DEPARTMENT_OTHER): Payer: Self-pay | Admitting: Family Medicine

## 2013-12-30 DIAGNOSIS — E039 Hypothyroidism, unspecified: Principal | ICD-10-CM

## 2013-12-30 LAB — HEMOGLOBIN A1C
ESTIMATED AVERAGE GLUCOSE: 88 (ref 74–160)
HEMOGLOBIN A1C: 4.7 % (ref 4.0–5.6)

## 2013-12-30 MED ORDER — LEVOTHYROXINE SODIUM 88 MCG PO TABS
88.0000 ug | ORAL_TABLET | Freq: Every day | ORAL | Status: DC
Start: 2013-12-30 — End: 2014-04-19

## 2013-12-30 NOTE — Progress Notes (Signed)
Results phone-call:    Telephone Information:   Home Phone [phone]   Work Phone [phone]   Mobile [phone]        Component    Latest Ref Rng 12/29/2013   WHITE BLOOD CELL COUNT    4.0 - 11.0 TH/uL 6.2   RED BLOOD CELL COUNT    3.90 - 5.20 M/uL 4.74   HEMOGLOBIN    11.2 - 15.7 g/dL 60.714.3   HEMATOCRIT    37.134.1 - 44.9 % 40.1   MEAN CORPUSCULAR VOL    80.0 - 100.0 fL 84.6   MEAN CORPUSCULAR HGB    26.0 - 34.0 pg 30.2   MEAN CORP HGB CONC    31.0 - 37.0 g/dL 06.235.7   RBC DISTRIBUTION WIDTH STD DEV    35.1 - 46.3 fL 39.8   RBC DISTRIBUTION WIDTH    11.5 - 14.3 % 12.9   PLATELET COUNT    150 - 400 TH/uL 256   MEAN PLATELET VOLUME    8.7 - 12.5 fL 10.8   HCG QUALITATIVE URINE     Negative   ONBOARD CONTROL PRSENT?     Yes   PROTHROMBIN TIME    9.6 - 12.3 SECONDS 11.5   INR    2.0 - 3.5 1.1   VITAMIN B12    193 - 986 pg/mL 424   TSH (THYROID STIM HORMONE)    0.358 - 3.740 uIU/mL 10.600 (H)     Left message on voicemail for patient that her thyroid level is off and she needs to be back on thyroid medication.  She should come back to see us in two months and sleep on her back to help with the arm pain at night.    Perina Salvaggio K. Kela MillinPong, MD, 12/30/2013, 9:18 AM       Brief chart review:  Patient Active Problem List    Overweight (BMI 25.0-29.9)         Date Noted: 01/20/2013            Referred to sports med clinic for exercise            prescription      Vitamin D deficiency         Date Noted: 01/20/2013            Repleted Dec, 2014. 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 Herrick, MD, 06/17/2011, 2:09 PM             HPV neg, so q 3 year paps.      Surveillance of previously prescribed intrauterine contraceptive device         Date Noted: 06/04/2011  Mirena placed 03/17/2012.             Adv sx w Paragard in past-- heavy bleeding            (removed 03/17/2012).       Unspecified hypothyroidism         Date Noted: 04/08/2011            On levo 100 mcg. Suppressed TSH x 2 in Sept & Oct            which resolved on             recheck Dec, 2014.             [ ]  check TSH June, 2015 (q6 months  X 1 year then            biannually if stable)           Current Outpatient Prescriptions on File Prior to Visit:  Prenatal Vit-Fe Fumarate-FA (PRENATAL VITAMINS) 28-0.8 MG TABS Take 1 tablet by mouth daily. Disp: 90 tablet Rfl: 3   ergocalciferol (VITAMIN D2) 50000 UNIT capsule Take 1 capsule by mouth once a week. Disp: 8 capsule Rfl: 0   [START ON 02/23/2014] cholecalciferol (VITAMIN D3) 2000 UNIT tablet Take 1 tablet by mouth daily. To pharm: pls d/c all other Vit D Rx Disp: 90 tablet Rfl: 3   levothyroxine (SYNTHROID, LEVOTHROID) 100 MCG tablet Take 1 tablet by mouth daily. Disp: 30 tablet Rfl: 5   naproxen (NAPROSYN) 500 MG tablet Take 1 tablet by mouth 2 (two) times daily as needed for Pain. Limit to 3 times per week Disp: 30 tablet Rfl: 0   levonorgestrel (MIRENA) 20 MCG/24HR IUD 1 each by Intrauterine route continuous. As Instructed Disp: 1 Device Rfl: 0   ibuprofen (ADVIL,MOTRIN) 800 MG tablet Take 1 tablet by mouth every 6 (six) hours as needed for Pain. Disp: 90 tablet Rfl: 0     No current facility-administered medications on file prior to visit.   Review of Patient's Allergies indicates:   Penicillins             Hives   No results found for this visit on 12/30/13 (from the past 24 hour(s)).

## 2014-01-01 ENCOUNTER — Telehealth (HOSPITAL_BASED_OUTPATIENT_CLINIC_OR_DEPARTMENT_OTHER): Payer: Self-pay | Admitting: Family Medicine

## 2014-01-01 NOTE — Progress Notes (Signed)
Pt calling with portuguese interpreter with several questions.  Wants to know about dose of thyroid medication, reason for change in dose of thyroid medication, and if thyroid medication will be a problem if she becomes pregnant.  Please call pt to discuss her clinical questions with a [place] Interpreter.  Phone is [phone].  Sydnee CabalStephanie Aayushi Solorzano, 01/01/2014, 11:49 AM

## 2014-01-01 NOTE — Progress Notes (Signed)
MAil box full unable to leave a message.

## 2014-01-03 LAB — PROTEIN ELECTROPHORESIS
PEP A/G RATIO: 1.3 (ref 0.7–2.0)
PEP ALBUMIN: 3.9 g/dL (ref 3.2–5.6)
PEP ALPHA 1 GLOBULINS: 0.2 g/dL (ref 0.1–0.4)
PEP ALPHA 2 GLOBULINS: 0.6 g/dL (ref 0.4–1.2)
PEP BETA GLOBULINS: 1 g/dL (ref 0.6–1.3)
PEP GAMMA GLOBULINS: 1.3 g/dL (ref 0.5–1.6)
PEP GLOBULIN TOTAL: 3.1 g/dL (ref 2.0–4.5)
PEP TOTAL PROTEIN: 7 g/dL (ref 6.0–8.5)

## 2014-01-03 NOTE — Progress Notes (Signed)
Telephone Information:   Home Phone [phone]   Work Phone [phone]   Mobile [phone]     I attempted to call this patient with an interpreter but it went straight to voicemail and her mailbox was full.    If she calls back, please clarify the dose of levothyroxine she has been taking and who is prescribing it.    Our last script on record was from 01/2013 for levothyroxine 100mcg PO daily but it was only written for x6 months. Since it appeared that she has not been on the medication x6 months, I ordered the starting dose of 88mcg PO daily rather than increasing the dose.

## 2014-01-04 NOTE — Progress Notes (Signed)
Attempted to call pt again with [place] interpreter, voicemail is full. [facility] to have pt call [facility] back.

## 2014-03-14 ENCOUNTER — Ambulatory Visit (HOSPITAL_BASED_OUTPATIENT_CLINIC_OR_DEPARTMENT_OTHER): Payer: Commercial Managed Care - PPO | Admitting: Family Medicine

## 2014-03-14 ENCOUNTER — Ambulatory Visit (HOSPITAL_BASED_OUTPATIENT_CLINIC_OR_DEPARTMENT_OTHER): Payer: Self-pay | Admitting: Family Medicine

## 2014-03-14 ENCOUNTER — Encounter (HOSPITAL_BASED_OUTPATIENT_CLINIC_OR_DEPARTMENT_OTHER): Payer: Self-pay | Admitting: Family Medicine

## 2014-03-14 VITALS — BP 110/68 | HR 63 | Temp 97.9°F

## 2014-03-14 DIAGNOSIS — M79609 Pain in unspecified limb: Secondary | ICD-10-CM

## 2014-03-14 DIAGNOSIS — R202 Paresthesia of skin: Principal | ICD-10-CM

## 2014-03-14 DIAGNOSIS — E039 Hypothyroidism, unspecified: Secondary | ICD-10-CM

## 2014-03-14 DIAGNOSIS — M79601 Pain in right arm: Principal | ICD-10-CM

## 2014-03-14 DIAGNOSIS — M7522 Bicipital tendinitis, left shoulder: Secondary | ICD-10-CM

## 2014-03-14 DIAGNOSIS — M79602 Pain in left arm: Principal | ICD-10-CM

## 2014-03-14 DIAGNOSIS — M79603 Pain in arm, unspecified: Secondary | ICD-10-CM

## 2014-03-14 LAB — BASIC METABOLIC PANEL
ANION GAP: 6 mmol/L (ref 5–15)
BUN (UREA NITROGEN): 11 mg/dL (ref 7–18)
CALCIUM: 8.6 mg/dL (ref 8.5–10.1)
CARBON DIOXIDE: 26 mmol/L (ref 21–32)
CHLORIDE: 106 mmol/L (ref 98–107)
CREATININE: 0.7 mg/dL (ref 0.4–1.2)
ESTIMATED GLOMERULAR FILT RATE: >60 mL/min (ref >60)
Glucose Random: 82 mg/dL (ref 74–160)
POTASSIUM: 4.3 mmol/L (ref 3.5–5.1)
SODIUM: 138 mmol/L (ref 136–145)

## 2014-03-14 LAB — TSH (THYROID STIMULATING HORMONE): TSH (THYROID STIM HORMONE): 6.9 u[IU]/mL — ABNORMAL HIGH (ref 0.358–3.740)

## 2014-03-14 LAB — XR CERVICAL SPINE WITH OBLIQUES 5 VIEWS

## 2014-03-14 LAB — XR SHOULDER LEFT MINIMUM 2 VIEWS

## 2014-03-14 NOTE — Progress Notes (Signed)
SUBJECTIVE:  Brenda Braun is a 34 year old female who presents for same day, walk in visit.    With the following concern(s):    #) Arm numbness and tingling-- since October. Started on the L (she is ambidexterous), worst at night, now involves both arms. She wakes up and both arms feel numb into her fingertips. Labs last month were wnl except for the TSH which showed that she was clinically hypothyroid despite being on thyroid replacement- the dose of levothyroxine was increased 2 1/2 months ago. Denies neck pain or assoc neck or shoulder trauma. No flick sign or pain in hands or arms.     #) L shoulder pain-- hurts her right in the front of her shoulder x 8-9 months. Worse with movement. Works as a Youth workerline chef, tries to avoid repetitive motion. Got better when she changed her position at night.     #) Hypothyroidism-- taking 88 mcg levothyroxine. No adv sx. Has been trying to get pregnant since Oct, is wondering whether her thyroid is preventing this.     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 Disp: 30 tablet Rfl: 5   Prenatal Vit-Fe Fumarate-FA (PRENATAL VITAMINS) 28-0.8 MG TABS Take 1 tablet by mouth daily. Disp: 90 tablet Rfl: 3   cholecalciferol (VITAMIN D3) 2000 UNIT tablet Take 1 tablet by mouth daily. To pharm: pls d/c all other Vit D Rx Disp: 90 tablet Rfl: 3   naproxen (NAPROSYN) 500 MG tablet Take 1 tablet by mouth 2 (two) times daily as needed for Pain. Limit to 3 times per week Disp: 30 tablet Rfl: 0   ibuprofen (ADVIL,MOTRIN) 800 MG tablet Take 1 tablet by mouth  every 6 (six) hours as needed for Pain. Disp: 90 tablet Rfl: 0     No current facility-administered medications for this visit.    ALLERGIES:  Review of Patient's Allergies indicates:   Penicillins             Hives    VITALS:  BP 110/68 mmHg   Pulse 63   Temp(Src) 97.9 F (36.6 C) (Temporal)   SpO2 98%  Pain Score: 9 (9/10)    Gen: overweight [place] F, no acute resp distress  Neck: full ROM in neck. Neg TTP over the vertebral spinous processes.  L shoulder: there is significant TTP over the biceps tendon. No TTP over the [facility] joint or humeral head. Full flexion. Abduction limited to 90 degrees d/t pain. Extension limited to 45 degrees d/t pain. 5/5 rotator cuff tendon strength. Neg Neers. Neg empty can test.  R shoulder: normal exam.   WRISTS: full ROM, intact strength. Neg   PSYCH: appropriate behavior, cooperative with exam. Mood is good, full congruent affect. Appropriate thought content. Organized thought process. Good insight and judgement      TSH (THYROID STIM HORMONE) (uIU/mL)   Date Value   12/29/2013 10.600*   01/19/2013 2.790   ----------    A/P:  Brenda Braun is a 34 year old  female who presents with:    (R20.2,  M79.603) Paresthesia and pain of both upper extremities  (primary encounter diagnosis)  Comment: ? Symptomatic hypothyroidism vs neck pathology given b/l UE involvement  Plan:   - COLLECTION VENOUS BLOOD VENIPUNCTURE, TSH (THYROID STIMULATING HORMONE)  - CHG RADEX SPINE CERVICAL 2 OR 3 VIEWS  - REFERRAL TO PHYSICAL THERAPY ( INT)    (M75.22) Biceps tendonitis on left  Comment: physical exam c/w with above diagnosis given exquisite TTP over the biceps tendon-- she is requesting an XR for her own peace of mind  Plan: XR SHOULDER LEFT MINIMUM 2 VW  - supportive tx with rest, ice, NSAIDs + PPI     (E03.9) Hypothyroidism, unspecified hypothyroidism type  Comment: levothyroxine dose adjusted 2 months ago for TSH 10.6-- needs TSH rechecked today  Plan: COLLECTION VENOUS BLOOD VENIPUNCTURE, TSH  (THYROID STIMULATING HORMONE)    Dispostion:  Return in 1 week to f/u on multiple issues.    *AVS printed  *Will call patient for results, okay to leave message      I have spent 25 minutes in face to face time with this patient/patient proxy of which > 50% was in counseling or coordination of care regarding above issues/Dx.

## 2014-03-15 ENCOUNTER — Telehealth (HOSPITAL_BASED_OUTPATIENT_CLINIC_OR_DEPARTMENT_OTHER): Payer: Self-pay | Admitting: Family Medicine

## 2014-03-15 DIAGNOSIS — M79601 Pain in right arm: Secondary | ICD-10-CM | POA: Insufficient documentation

## 2014-03-15 DIAGNOSIS — M79602 Pain in left arm: Secondary | ICD-10-CM

## 2014-03-15 DIAGNOSIS — R202 Paresthesia of skin: Secondary | ICD-10-CM

## 2014-03-15 DIAGNOSIS — M7522 Bicipital tendinitis, left shoulder: Secondary | ICD-10-CM | POA: Insufficient documentation

## 2014-03-15 MED ORDER — NAPROXEN 500 MG PO TABS
500.0000 mg | ORAL_TABLET | Freq: Two times a day (BID) | ORAL | Status: DC
Start: 2014-03-15 — End: 2014-04-18

## 2014-03-15 MED ORDER — OMEPRAZOLE 20 MG PO CPDR
20.0000 mg | DELAYED_RELEASE_CAPSULE | Freq: Every day | ORAL | Status: DC
Start: 2014-03-15 — End: 2014-04-18

## 2014-03-15 NOTE — Progress Notes (Signed)
Patient reached to discuss imaging and blood work.    TSH = 6.9. Asymptomatic. Missing 2-3 doses per week. Through shared decision making, decided to keep levo dose at 88 mcg. She will set an alarm and take first thing in the morning every morning, and we will reheck the thyroid levels in 1 month. She has enough pills to last until f/u .    B/l arm numbness- neck imaging neg. Sees PT 04/08/14.    L shoulder pain- neg shoulder XR. Probable biceps tendonitis. Encouraged rest, avoiding heavy lifting, ice massage at end of the day- have rx'ed naproxen BID, omeprazole 20 mg daily to pharmacy.    Jayce Kainz MD, 03/15/2014, 12:07 PM

## 2014-03-17 ENCOUNTER — Ambulatory Visit (HOSPITAL_BASED_OUTPATIENT_CLINIC_OR_DEPARTMENT_OTHER): Payer: Commercial Managed Care - PPO | Admitting: Family Medicine

## 2014-03-21 ENCOUNTER — Ambulatory Visit (HOSPITAL_BASED_OUTPATIENT_CLINIC_OR_DEPARTMENT_OTHER): Payer: Commercial Managed Care - PPO | Admitting: Family Medicine

## 2014-04-08 ENCOUNTER — Ambulatory Visit (HOSPITAL_BASED_OUTPATIENT_CLINIC_OR_DEPARTMENT_OTHER): Payer: Self-pay | Admitting: Rehabilitative and Restorative Service Providers"

## 2014-04-11 ENCOUNTER — Ambulatory Visit (HOSPITAL_BASED_OUTPATIENT_CLINIC_OR_DEPARTMENT_OTHER): Payer: Commercial Managed Care - PPO | Admitting: Rehabilitative and Restorative Service Providers"

## 2014-04-11 DIAGNOSIS — M25512 Pain in left shoulder: Principal | ICD-10-CM

## 2014-04-11 NOTE — Progress Notes (Signed)
OUTPATIENT PHYSICAL THERAPY EVALUATION    Referring Physician: Danit Brahver  [address]  [place], [place] [address]      Subjective: Pt is a 34 y/o female with c/o L shoulder pain, B UE numbness, R wrist pain, low back pain, and neck pain for 1.5 years without particular cause. Patient reports L shoulder pain is anterior. Reports symptoms worsen at night where she gets numbness into L > R arms. Prefers to sleep in L S/L. Also reports neck stiffness and pain and HAs. Imaging of L shoulder and neck are unremarkable. MD diagnosed patient with biceps tendinitis. Patient is right handed, but uses R & L for her job tasks. Not using ice or meds.     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 Disp: 60 tablet Rfl: 1   omeprazole (PRILOSEC) 20 MG capsule Take 1 capsule by mouth daily. Disp: 30 capsule Rfl: 1   levothyroxine (SYNTHROID, LEVOTHROID) 88 MCG tablet Take 1 tablet by mouth daily. Disp: 30 tablet Rfl: 5   Prenatal Vit-Fe Fumarate-FA (PRENATAL VITAMINS) 28-0.8 MG TABS Take 1 tablet by mouth daily. Disp: 90 tablet Rfl: 3   cholecalciferol (VITAMIN D3) 2000 UNIT tablet Take 1 tablet by mouth daily. To pharm: pls d/c all other Vit D Rx Disp: 90 tablet Rfl: 3     No current facility-administered  medications on file prior to visit.      Contraindications/Precautions: None    Objective:          04/11/14 1351   Language Information   Language of Care Portuguese   Interpreter Yes   Evaluation Type   Evaluation Type Initial Evaluation   Rehab Discipline   Rehab Discipline PT   Visit   Visit number 1   POC Due date 05/23/14   Pain   Pain Score 8    Precautions   Precautions No   Patient Stated Goals   Patient stated goals less pain   Posture   Rounded Shoulders Moderate   Protracted Scapulae  Moderate   L Elevated Shoulder  Minimal   Kyphosis Minimal   Cervical Assessment   Overall Cervical WNL  (C/o pain with flex, R SB)   LUE AROM (degrees)   L Shoulder Flexion  0-170 135 Degrees   L Shoulder ABduction 0-40 103 Degrees   L Shoulder Internal Rotation  0-70 (Funct to mid-thoracic c/o pain)   L Shoulder External Rotation  0-90 (Funct to Tspine (c/o pain))   LUE PROM (degrees)   L Shoulder Flexion  0-170 150 Degrees   L Shoulder ABduction 0-40 114 Degrees   L Shoulder Internal  Rotation  0-70 45 Degrees  (at 90 deg abd)   L Shoulder External Rotation  0-90 55 Degrees  (at 90 deg abd)   L Shoulder Horizontal ADduction 0-130 130 Degrees   LUE Strength   L Shoulder Flexion 4-/5   L Shoulder ABduction 4-/5   L Shoulder Internal Rotation 4-/5   L Shoulder External Rotation 4-/5   L Elbow Flexion 4+/5   L Elbow Extension 4/5   LUE Muscle Length Assessment   Overall Muscle length  Mod decreased L UT, scalenes   Scapular MMT   MMT Lower Trap 3-/5   MMT Mid Trap 3-/5   MMT Rhomboid 3+/5   RUE Assessment   RUE Assessment WFL   Cervical Results   Distraction  Negative   Spurling's  Negative   Quick Test  (decreased upper thoracic glides with pain)   Left Shoulder results   L Speed's Positive   L Full can  Positive   L ER Lag Positive   Palpation   Tenderness to Palpation ant acromion, LH biceps, L UT, PA glides cervicothoracic junction, supraspinatus   Increased Tissue Density L UT, LH biceps   Other ant HH position   Left  Shoulder Glenohumeral   L Glenohumeral Anterior 3/6   L Glenohumeral Posterior 2/6   L Glenohumeral inferior 3/6   L Glenohumeral distraction 3/6   Left Shoulder Acromioclavicular   L Acromioclavicular Ventrolateral 3/6   Cervical   Passive Accessory Intervertebral Movement (PAIVM) 3/6   Thoracic   Passive Accessory Intervertebral Movement (PAIVM) 2/6   Patient Education   What was taught? role of PT, POC, HEP   Method Verbal;Demo;Practice;Written   Patient comprehension Yes           Physical Therapy Plan of Care    WU:[id number]:BRAHVER,DANIT MD  Referring Provider: Danit Brahver  [address]  [place], [place] [id number]    Diagnosis: L shoulder pain, tendonitis, neck pain    Assessment/Objective Findings:   Patient is a 34 year old female with complains of pain in her left Shoulder.     Pt reports onset of pain 1.5 years ago  due to overuse injury. 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 Pt expresses long term goal of less pain, is motivated to work towards this in PT.    Clinical presentation today is consistent with L shoulder tendinitis/tendinosis (supraspinatus and biceps) as well as neck pain associated with postural deficits and overuse of UT,LS  and pt will benefit from skilled PT focused on shoulder mobility, shoulder and scapular strengthening, thoracic mobility, STM PRN, scapulohumeral mechanics, education/posture to address the following problems and impairments noted upon evaluation: Pain, Decreased ROM, Decreased Strength, Decreased Functional Mobility, Decreased Joint Mobility and Decreased Tolerance of ADLs.     These problems limit the patient with the following functional activities: lifting, driving, reaching overhead or behind the back. The prescribed treatment plan of care is medically necessary.    Co-morbidities of none were identified and taken into considerations of plan of care.    Short Term Functional Goals: 4 weeks.   Pt will be independent  with HEP  Pt will demonstrate full PROM L shoulder   Pt will demonstrate increased L shoulder strength by 1 MMT grade  Pt will demonstrate increased scapular strength by 1 MMT grade  Pt will demonstrate full AROM, pain free L shoulder motion   Pt will demonstrate increased thoracic accessory glides to 3/6  Pt will  demonstrate UT length WNL  Pt will demonstrate proper scapulohumeral rhythm with overhead movements  Pt will report decreased average pain to < 4/10    Long Term Goal: 6 weeks.   Pt will report ability to perform all functional and work related tasks without c/o shoulder pain    Treatment Plan:  ** PT Eval (CPT 97001)  ** Stretching/ROM Exercise [phone])  ** Therapeutic Exercise (CPT 97110)  ** Home Exercise Program (CPT [phone])  ** Neuromuscular Re-education (CPT [id number])  ** Joint Mobilization (CPT 97140)  ** Soft Tissue Mobilization (CPT 97140)  ** Electrical Stimulation/TENS (CPT 97014)  ** Hot/Cold Rx (CPT 97010)  ** Functional Activities (CPT 97530)  ** Patient Education (CPT [phone])    Recommend Physical Therapy be continued 2 times per week for 6 weeks.  The rehabilitation potential for this patient is fair/good given low tolerance and high pain levels    Patient Meryle Ready is aware of attendance policy: Yes  Plan of care discussed with Patient/Family: Yes  Patient goals reviewed and incorporated in plan of care: Yes  Patient/Family agrees with plan of care: Yes  Patient/Family education: Yes  Does patient feel safe at home: Yes      Demetrius Revel, PT

## 2014-04-11 NOTE — Progress Notes (Signed)
I agree with the outlined physical therapy treatment plan detailed below. Dillard Pascal MD, 04/11/2014, 3:40 PM

## 2014-04-11 NOTE — Progress Notes (Signed)
04/11/14 1351   Language Information   Language of Care Portuguese   Interpreter Yes   Precautions   Precautions No   Rehab Discipline   Rehab Discipline PT   Visit   Visit number 1   POC Due date 05/23/14   Time Calculation   Start Time 1305   Stop Time 1350   Time Calculation (min) 45 min   Pain   Pain Score 8    Ther Exercise   Exercise Table slides flexion, abd   Ther Exercise 2   Exercise ER with AAROM stick   Ther Exercise 3   Exercise 3 Scap retractions   Other   Other Treatments (Use of ice)   Patient Education   What was taught? role of PT, POC, HEP   Method Verbal;Demo;Practice;Written   Patient comprehension Yes

## 2014-04-18 ENCOUNTER — Encounter (HOSPITAL_BASED_OUTPATIENT_CLINIC_OR_DEPARTMENT_OTHER): Payer: Self-pay | Admitting: Family Medicine

## 2014-04-18 ENCOUNTER — Ambulatory Visit (HOSPITAL_BASED_OUTPATIENT_CLINIC_OR_DEPARTMENT_OTHER): Payer: Commercial Managed Care - PPO | Admitting: Family Medicine

## 2014-04-18 VITALS — BP 100/64 | HR 75 | Temp 97.0°F | Wt 172.5 lb

## 2014-04-18 DIAGNOSIS — B001 Herpesviral vesicular dermatitis: Secondary | ICD-10-CM

## 2014-04-18 DIAGNOSIS — M79603 Pain in arm, unspecified: Secondary | ICD-10-CM

## 2014-04-18 DIAGNOSIS — R142 Eructation: Secondary | ICD-10-CM

## 2014-04-18 DIAGNOSIS — B009 Herpesviral infection, unspecified: Secondary | ICD-10-CM

## 2014-04-18 DIAGNOSIS — M79601 Pain in right arm: Secondary | ICD-10-CM

## 2014-04-18 DIAGNOSIS — R202 Paresthesia of skin: Secondary | ICD-10-CM

## 2014-04-18 DIAGNOSIS — R141 Gas pain: Secondary | ICD-10-CM | POA: Insufficient documentation

## 2014-04-18 DIAGNOSIS — M7522 Bicipital tendinitis, left shoulder: Secondary | ICD-10-CM

## 2014-04-18 DIAGNOSIS — Z3169 Encounter for other general counseling and advice on procreation: Secondary | ICD-10-CM

## 2014-04-18 DIAGNOSIS — R143 Flatulence: Secondary | ICD-10-CM

## 2014-04-18 DIAGNOSIS — M79609 Pain in unspecified limb: Secondary | ICD-10-CM

## 2014-04-18 DIAGNOSIS — M7520 Bicipital tendinitis, unspecified shoulder: Secondary | ICD-10-CM

## 2014-04-18 DIAGNOSIS — M79602 Pain in left arm: Secondary | ICD-10-CM

## 2014-04-18 DIAGNOSIS — E039 Hypothyroidism, unspecified: Principal | ICD-10-CM

## 2014-04-18 LAB — CBC, PLATELET & DIFFERENTIAL
ABSOLUTE BASO COUNT: 0 10*3/uL (ref 0.0–0.1)
ABSOLUTE EOSINOPHIL COUNT: 0.1 10*3/uL (ref 0.0–0.8)
ABSOLUTE IMM GRAN COUNT: 0.01 10*3/uL (ref 0.00–0.03)
ABSOLUTE LYMPH COUNT: 1.1 10*3/uL (ref 0.6–5.9)
ABSOLUTE MONO COUNT: 0.5 10*3/uL (ref 0.2–1.4)
ABSOLUTE NEUTROPHIL COUNT: 2.5 10*3/uL (ref 1.6–8.3)
BASOPHIL %: 0.2 % (ref 0.0–1.2)
EOSINOPHIL %: 2.4 % (ref 0.0–7.0)
HEMATOCRIT: 39.5 % (ref 34.1–44.9)
HEMOGLOBIN: 14.2 g/dL (ref 11.2–15.7)
IMMATURE GRANULOCYTE %: 0.2 % (ref 0.0–0.4)
LYMPHOCYTE %: 26.3 % (ref 15.0–54.0)
MEAN CORP HGB CONC: 35.9 g/dL (ref 31.0–37.0)
MEAN CORPUSCULAR HGB: 30.1 pg (ref 26.0–34.0)
MEAN CORPUSCULAR VOL: 83.9 fL (ref 80.0–100.0)
MEAN PLATELET VOLUME: 10.7 fL (ref 8.7–12.5)
MONOCYTE %: 11.1 % (ref 4.0–13.0)
NEUTROPHIL %: 59.8 % (ref 40.0–75.0)
PLATELET COUNT: 240 10*3/uL (ref 150–400)
RBC DISTRIBUTION WIDTH STD DEV: 40.3 fL (ref 35.1–46.3)
RBC DISTRIBUTION WIDTH: 13.3 % (ref 11.5–14.3)
RED BLOOD CELL COUNT: 4.71 M/uL (ref 3.90–5.20)
WHITE BLOOD CELL COUNT: 4.2 10*3/uL (ref 4.0–11.0)

## 2014-04-18 MED ORDER — ACYCLOVIR 5 % EX OINT
TOPICAL_OINTMENT | CUTANEOUS | Status: AC
Start: 2014-04-18 — End: 2015-04-18

## 2014-04-18 MED ORDER — ACYCLOVIR 200 MG PO CAPS
200.00 mg | ORAL_CAPSULE | Freq: Every day | ORAL | Status: AC
Start: 2014-04-18 — End: 2014-04-23

## 2014-04-18 MED ORDER — DICLOFENAC SODIUM 50 MG PO TBEC
50.00 mg | DELAYED_RELEASE_TABLET | Freq: Three times a day (TID) | ORAL | Status: AC | PRN
Start: 2014-04-18 — End: 2014-05-18

## 2014-04-18 NOTE — Progress Notes (Signed)
SUBJECTIVE:  Brenda Braun is a 34 year old female who presents for a f/u visit.    With the following concern(s):    #) Hypothyroidism:      TSH (THYROID STIM HORMONE) (uIU/mL)   Date Value   03/14/2014 6.900*   12/29/2013 10.600*   01/19/2013 2.790   ----------    Was missing 2-3 doses per week last month when TSH = 6.9. No dose adjustment made.  Today, she reports that she has been taking 88 mcg levothyroxine every day, not missing doses.     #) L shoulder pain and numbness/tingling-- improving significantly with PT! Now doing home PT.    #) Bloating-- for 6-7 years, she feels very bloated and full with diffuse epigastric pain after eating since her gallbladder was removed. Traveled to [place] in July of last year. Swims in the ocean and river when in [place].  Never been treated for h pylori, has been treated for schisto in the past in [place] (2010).    #) Pre-conception counseling--  IUD removed 4 months ago in November  Periods:  12/31/2013 - 01/03/14  01/31/14-02/03/14  03/03/14  2/15-2/18/16   Doesn't understand when to have sex.    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 Disp: 60 tablet Rfl: 1   levothyroxine (SYNTHROID, LEVOTHROID) 88 MCG tablet Take 1 tablet by mouth daily. Disp: 30 tablet Rfl: 5   Prenatal Vit-Fe Fumarate-FA (PRENATAL VITAMINS) 28-0.8 MG TABS Take 1 tablet by mouth daily. Disp: 90 tablet Rfl: 3   cholecalciferol (VITAMIN D3) 2000 UNIT tablet Take 1 tablet by mouth daily. To pharm: pls d/c all other Vit D Rx Disp: 90 tablet Rfl: 3     No current facility-administered medications for this visit.    ALLERGIES:  Review of Patient's Allergies indicates:   Penicillins             Hives    VITALS:  BP 100/64 mmHg   Pulse 75   Temp(Src) 97 F (36.1 C) (Temporal)   Wt 78.25 kg (172 lb 8.2 oz)   SpO2 99%  Pain Score: Data Unavailable    GEN: well-appearing F, well-groomed  HEENT: NCAT. PERRLA. EOMI.  PSYCH: appropriate behavior, cooperative with exam. Mood is good, full congruent affect. Appropriate thought content. Organized thought process. Good insight and judgement  NEURO: grossly intact.    A/P:  Brenda Braun is a 34 year old female who presents with:    (E03.9) Hypothyroidism, unspecified hypothyroidism type  (primary encounter diagnosis)  Comment: sub-optimally controlled d/t inconsistent use. Due for recheck today. Goal <  2.5 in setting of pre-conception counseling.  Plan: COLLECTION VENOUS BLOOD VENIPUNCTURE, TSH         (THYROID STIMULATING HORMONE)    (Z31.69) Pre-conception counseling  Comment: 34 yo G3P2 overweight hypothyroid F, trying to conceive x 4 months since Mirena IUD was removed. Ovulatory cycles.  Plan:   - pre-conception counseling done-- reviewed fertile window  - has prenatal vitamin  - cautioned about getting pregnant when thyroid is suboptimally controlled  - f/u in 4-6 weeks to recheck thyroid function    (R14.3,  R14.1,  R14.2) Flatulence, eructation, and gas pain  Comment: [place] F s/p cholecystectomy with 5-6 years of chronic dyspepsia. She is concerned abt underlying infectious etiology and requests infectious work up  Plan:  HEL[facility]YL[facility]I [facility]OOL [facility] (INHOUSE),         SCHISTOSOMAL AB IGG, STRONGYLOIDES AB IGG, CBC         + PLT + AUTO DIFF    (B00.1) Cold sore  Comment: stable, needs refills of cream & oral tablets.  Plan: acyclovir (ZOVIRAX) 5 % ointment, acyclovir         (ZOVIRAX) 200 MG capsule  Dispostion:  Return in 4-6 weeks to check thyroid.    *AVS printed  *Will call patient for results, okay to leave message

## 2014-04-19 MED ORDER — LEVOTHYROXINE SODIUM 88 MCG PO TABS
88.0000 ug | ORAL_TABLET | Freq: Every day | ORAL | Status: DC
Start: 2014-04-19 — End: 2014-07-12

## 2014-04-19 NOTE — Addendum Note (Signed)
Addended by: Willis ModenaBRAHVER, Esmee Fallaw on: 04/19/2014 06:17 AM     Modules accepted: Orders, Medications

## 2014-04-20 ENCOUNTER — Ambulatory Visit (HOSPITAL_BASED_OUTPATIENT_CLINIC_OR_DEPARTMENT_OTHER): Payer: Commercial Managed Care - PPO | Admitting: Physical Therapy

## 2014-04-20 ENCOUNTER — Telehealth (HOSPITAL_BASED_OUTPATIENT_CLINIC_OR_DEPARTMENT_OTHER): Payer: Self-pay | Admitting: Family Medicine

## 2014-04-20 NOTE — Progress Notes (Signed)
Patient reached via [place] interpreter. Discussed nml thyroid result with help of 88 mcg levothyroxine. She will continue to take levothyroxine as prescribed. Will f/u in 2-3 months to check in around success with conceiving. Reviewed that we dont need to check the thyroid for the next 6 months, only sooner if she becomes pregnant or develops symptoms.    I will call her back soon as results of stronge/schisto testing is back.    Duy Lemming MD, 04/20/2014, 2:39 PM

## 2014-04-22 ENCOUNTER — Ambulatory Visit (HOSPITAL_BASED_OUTPATIENT_CLINIC_OR_DEPARTMENT_OTHER): Payer: Commercial Managed Care - PPO | Admitting: Physical Therapy

## 2014-04-25 LAB — TSH (THYROID STIMULATING HORMONE): TSH (THYROID STIM HORMONE): 2.98 u[IU]/mL (ref 0.358–3.740)

## 2014-04-25 LAB — SCHISTOSOMAL AB IGG: SCHISTOSOMAL AB IgG: 6.28 INDEX (ref 0.00–8.99)

## 2014-04-25 LAB — STRONGYLOIDES AB IGG: STRONGYLOIDES AB IgG: 4.65 INDEX (ref 0.00–9.99)

## 2014-04-27 ENCOUNTER — Ambulatory Visit (HOSPITAL_BASED_OUTPATIENT_CLINIC_OR_DEPARTMENT_OTHER): Payer: Commercial Managed Care - PPO | Admitting: Physical Therapy

## 2014-04-27 DIAGNOSIS — M25512 Pain in left shoulder: Principal | ICD-10-CM

## 2014-04-27 NOTE — Progress Notes (Signed)
.  S: Pt report L shld pain 7/10.  O: Refer to Rehabilitation Treatment Flowsheet  A: Initiated with graston technique to decrease muscle density on L upper trap and bicep tendon with some of good results. Cont with post shld glide which pt tolerated well. Pt followed with stretching and AAROM with stick and scapular strengthening ex's which c/o increase of pain with all ex's. Instructed to cont at home with HEP as pain allow. Pt verbalized understanding.  P: Cont with PT per POC

## 2014-04-27 NOTE — Progress Notes (Signed)
04/27/14 1200   Language Information   Language of Care Portuguese   Interpreter No   Precautions   Precautions No   Rehab Discipline   Rehab Discipline PT   Visit   Visit number 2   POC Due date 05/23/14   Time Calculation   Start Time 1210   Stop Time 1240   Time Calculation (min) 30 min   Pain   Pain Score 7    Manual Therapy   Technique Graston STM to L upper trap,supraspinatus ,bicep tendon 8 min   Manual Therapy 2   Technique post shld glide post x 3 min    Manual Therapy 3   Technique PROM all pplanes 5 min   Ther Exercise   Exercise B shld AAROM flex with stick 2x10   Ther Exercise 2   Exercise ER with AAROM stick 2x10   Ther Exercise 3   Exercise 3 seatetd Scap retractions hold 3 sec 2x10   Ther Exercise 4   Exercise 4 AAROM abd with stick 1x10   Ther Exercise 5   Exercise 5 upper trap strech 30s ec x 2    Ther Exercise 6   Exercise 6 supine pectoralis stretch 20 sec x 2    Patient Education   What was taught? cont with HEP   Method Verbal;Demo;Practice   Patient comprehension Yes

## 2014-04-28 ENCOUNTER — Other Ambulatory Visit (HOSPITAL_BASED_OUTPATIENT_CLINIC_OR_DEPARTMENT_OTHER): Payer: Self-pay | Admitting: Family Medicine

## 2014-04-28 ENCOUNTER — Telehealth (HOSPITAL_BASED_OUTPATIENT_CLINIC_OR_DEPARTMENT_OTHER): Payer: Self-pay | Admitting: Family Medicine

## 2014-04-28 DIAGNOSIS — R141 Gas pain: Principal | ICD-10-CM

## 2014-04-28 DIAGNOSIS — Z9882 Breast implant status: Principal | ICD-10-CM

## 2014-04-28 DIAGNOSIS — R142 Eructation: Principal | ICD-10-CM

## 2014-04-28 DIAGNOSIS — R143 Flatulence: Principal | ICD-10-CM

## 2014-04-28 NOTE — Progress Notes (Signed)
[facility].    Alexa Golebiewski MD, 04/28/2014, 2:47 PM

## 2014-04-28 NOTE — Progress Notes (Signed)
Discussed negative schisto/stronge results with patient. Symptoms are mild but persistent, will return stool h pylori asap.    Tishina Lown MD, 04/28/2014, 2:37 PM

## 2014-04-29 ENCOUNTER — Ambulatory Visit (HOSPITAL_BASED_OUTPATIENT_CLINIC_OR_DEPARTMENT_OTHER): Payer: Commercial Managed Care - PPO | Admitting: Rehabilitative and Restorative Service Providers"

## 2014-04-29 ENCOUNTER — Telehealth (HOSPITAL_BASED_OUTPATIENT_CLINIC_OR_DEPARTMENT_OTHER): Payer: Self-pay | Admitting: Family Medicine

## 2014-04-29 NOTE — Progress Notes (Unsigned)
.  Called pt with help from inter services to inform the pt needs to call [facility] to register as a pt then call the referral line back with her BI MR# so we can proceed with schedulding her appt for Plastic Surgery.

## 2014-05-04 ENCOUNTER — Ambulatory Visit (HOSPITAL_BASED_OUTPATIENT_CLINIC_OR_DEPARTMENT_OTHER): Payer: Commercial Managed Care - PPO | Admitting: Physical Therapy

## 2014-05-06 ENCOUNTER — Ambulatory Visit (HOSPITAL_BASED_OUTPATIENT_CLINIC_OR_DEPARTMENT_OTHER): Payer: Commercial Managed Care - PPO | Admitting: Physical Therapy

## 2014-05-11 ENCOUNTER — Ambulatory Visit (HOSPITAL_BASED_OUTPATIENT_CLINIC_OR_DEPARTMENT_OTHER): Payer: Commercial Managed Care - PPO | Admitting: Physical Therapy

## 2014-05-13 ENCOUNTER — Ambulatory Visit (HOSPITAL_BASED_OUTPATIENT_CLINIC_OR_DEPARTMENT_OTHER): Payer: Commercial Managed Care - PPO | Admitting: Rehabilitative and Restorative Service Providers"

## 2014-05-25 ENCOUNTER — Ambulatory Visit (HOSPITAL_BASED_OUTPATIENT_CLINIC_OR_DEPARTMENT_OTHER): Payer: Commercial Managed Care - PPO | Admitting: Physical Therapy

## 2014-06-01 ENCOUNTER — Ambulatory Visit (HOSPITAL_BASED_OUTPATIENT_CLINIC_OR_DEPARTMENT_OTHER): Payer: Commercial Managed Care - PPO | Admitting: Rehabilitative and Restorative Service Providers"

## 2014-06-15 ENCOUNTER — Ambulatory Visit (HOSPITAL_BASED_OUTPATIENT_CLINIC_OR_DEPARTMENT_OTHER): Payer: Commercial Managed Care - PPO | Admitting: Rehabilitative and Restorative Service Providers"

## 2014-07-12 ENCOUNTER — Encounter (HOSPITAL_BASED_OUTPATIENT_CLINIC_OR_DEPARTMENT_OTHER): Payer: Self-pay | Admitting: Family Medicine

## 2014-07-12 ENCOUNTER — Ambulatory Visit (HOSPITAL_BASED_OUTPATIENT_CLINIC_OR_DEPARTMENT_OTHER): Payer: Commercial Managed Care - PPO | Admitting: Family Medicine

## 2014-07-12 VITALS — BP 110/70 | HR 66 | Temp 98.5°F | Wt 174.0 lb

## 2014-07-12 DIAGNOSIS — E038 Other specified hypothyroidism: Secondary | ICD-10-CM

## 2014-07-12 DIAGNOSIS — E039 Hypothyroidism, unspecified: Secondary | ICD-10-CM

## 2014-07-12 DIAGNOSIS — Z3490 Encounter for supervision of normal pregnancy, unspecified, unspecified trimester: Secondary | ICD-10-CM

## 2014-07-12 DIAGNOSIS — Z331 Pregnant state, incidental: Secondary | ICD-10-CM

## 2014-07-12 DIAGNOSIS — J301 Allergic rhinitis due to pollen: Principal | ICD-10-CM

## 2014-07-12 LAB — CBC WITH PLATELET
HEMATOCRIT: 38.3 % (ref 34.1–44.9)
HEMOGLOBIN: 13.1 g/dL (ref 11.2–15.7)
MEAN CORP HGB CONC: 34.2 g/dL (ref 31.0–37.0)
MEAN CORPUSCULAR HGB: 29.1 pg (ref 26.0–34.0)
MEAN CORPUSCULAR VOL: 85.1 fL (ref 80.0–100.0)
MEAN PLATELET VOLUME: 11.1 fL (ref 8.7–12.5)
PLATELET COUNT: 237 10*3/uL (ref 150–400)
RBC DISTRIBUTION WIDTH STD DEV: 42.9 fL (ref 35.1–46.3)
RBC DISTRIBUTION WIDTH: 14 % (ref 11.5–14.3)
RED BLOOD CELL COUNT: 4.5 M/uL (ref 3.90–5.20)
WHITE BLOOD CELL COUNT: 6.3 10*3/uL (ref 4.0–11.0)

## 2014-07-12 LAB — TYPE AND SCREEN
ABO/RH INTERPRETATION: O POS
ANTIBODY SCREEN SOLID PHASE: NEGATIVE

## 2014-07-12 LAB — URINE PREGNANCY TEST (POINT OF CARE): HCG QUALITATIVE URINE: POSITIVE

## 2014-07-12 MED ORDER — SALINE NASAL SPRAY 0.65 % NA SOLN
1.00 | NASAL | 0 refills | Status: AC | PRN
Start: 2014-07-12 — End: 2014-07-22

## 2014-07-12 MED ORDER — CETIRIZINE HCL 5 MG PO TABS
5.0000 mg | ORAL_TABLET | Freq: Every day | ORAL | 0 refills | Status: DC
Start: 2014-07-12 — End: 2014-07-22

## 2014-07-12 MED ORDER — LEVOTHYROXINE SODIUM 88 MCG PO TABS
88.0000 ug | ORAL_TABLET | Freq: Every day | ORAL | 0 refills | Status: DC
Start: 2014-07-12 — End: 2014-07-13

## 2014-07-12 NOTE — Progress Notes (Signed)
Clinic Note:    SUBJECTIVE:  Brenda Braun is a 34 year old female with the following active problem list:    Patient Active Problem List:     Unspecified hypothyroidism     Pap smear abnormality of cervix with ASCUS favoring benign     Sleepwalking disorder     Adjustment disorder with anxiety     Overweight (BMI 25.0-29.9)     Vitamin D deficiency     Biceps tendinitis on left     Paresthesia of both upper extremities     Left shoulder pain     Flatulence, eructation, and gas pain    Pt showed up for appt 16 minutes late    Due to the language barrier, the office visit was conducted in [place] with an interpreter.  The interpreter was on the phone during the entire visit.        CC:     1) pregnant  -urine pregnancy positive today   -positive pregnancy test at home  -wants to be pregnant  -no pelvic pain, bleeding, fevers    2) allergies  -x 3 wks  -sneezing, nasal congestion, dry cough   -No fevers, sore throat, earache  -was taking benadryl, flonase and doxycyline prescribed by walk in clinic    3) hypothyroid  -now pregnant  -needs rechecking  -requesting refills of levothyroxine        Past Medical History    Pyelonephritis          Past Surgical History    LAPAROSCOPY SURG CHOLECYSTECTOMY  2007    LIPOSUCTION      OB ANTEPARTUM CARE CESAREAN DLVR & POSTPARTUM      CHOLECYSTECTOMY           Current Outpatient Prescriptions on File Prior to Visit:  levothyroxine (SYNTHROID, LEVOTHROID) 88 MCG tablet Take 1 tablet by mouth daily. Disp: 30 tablet Rfl: 11   acyclovir (ZOVIRAX) 5 % ointment Apply  topically every 3 (three) hours. Disp: 30 g Rfl: 3   Prenatal Vit-Fe Fumarate-FA (PRENATAL VITAMINS) 28-0.8 MG TABS Take 1 tablet by mouth daily. Disp: 90 tablet Rfl: 3   cholecalciferol (VITAMIN D3) 2000 UNIT tablet Take 1 tablet by mouth daily. To pharm: pls d/c all other Vit D Rx Disp: 90 tablet Rfl: 3     No current facility-administered medications on file prior to visit.     Review of Patient's Allergies  indicates:   Penicillins             Hives      Family History    Cancer - Breast FamHxNeg     Heart Disease FamHxNeg     Diabetes      Stroke Father [id number]    Comment: x 2       Social History    Marital status: Married             Spouse name:                       Years of education:                 Number of children:               Social History Main Topics    Smoking status: Never Smoker  Sexual activity: Yes               Partners with: Female       Birth control/protection: IUD    Social History Narrative    Moved from [place] 6 months ago.    Lives with husband and children 7yo and 48 yo sons.    No working.     Student english.    Feels safe at home.        OBJECTIVE:  BP 110/70  Pulse 66  Temp 98.5 F (36.9 C) (Temporal)  Wt 78.9 kg (174 lb)  LMP 05/29/2014  SpO2 99%  BMI 28.99 kg/m2              Physical Exam   Constitutional: She appears well-developed and well-nourished.   HENT:   Right Ear: Tympanic membrane is not erythematous and not bulging.   Left Ear: Tympanic membrane is not erythematous and not bulging.   Nose: Mucosal edema and rhinorrhea present.   Mouth/Throat: Uvula is midline and oropharynx is clear and moist.   Pulmonary/Chest: Effort normal and breath sounds normal. No respiratory distress. She has no wheezes.   Neurological: She is alert.   Skin: Skin is warm and dry.   Psychiatric: She has a normal mood and affect. Her behavior is normal. Judgment and thought content normal.       A/P:    (Z33.1) Pregnant  (primary encounter diagnosis)  Comment: urine hcg positive today. Pt denies pelvic pain, bleeding, discharge  Plan:   -AMPLIFIED GENPROBE CHLAM/GC, CBC WITH PLATELET,        HEP B SURFACE ANTIGEN, HIV ANTIGEN ANTIBODY,         RUBELLA, TREPONEMA PALLIDUM AB IGG, TYPE AND         SCREEN, [place] PREGNANT UTERUS 14 WK TRANSABDL         1/1ST GESTAT, [place] PREG UTERUS REAL TIME W/IMAGE         DCMTN TRANSVAG, HCG QUANTITATIVE, URINE          CULTURE  -f/u 1 wk for initial pnv          (J30.1) Allergic rhinitis due to pollen  Plan:   -instructed pt to stop doxycycline, flonase and benadryl   -sodium chloride (OCEAN) 0.65 % nasal spray,         cetirizine (ZYRTEC) 5 MG tablet  -f/u if not getting better           (E03.8) Other specified hypothyroidism  Comment: Pt pregnant  Plan:   -recheck TSH (THYROID STIMULATING HORMONE)  -refilled levothyroxine                 1. The patient indicates understanding of these issues and agrees with the plan.  2.  The patient is given an After Visit Summary sheet that lists all of their medications with directions, their allergies, orders placed during this encounter, immunization dates, and follow- up instructions.  3. I reviewed the patient's medical information and medical history   4.  I reconciled the patient's medication list and prepared and supplied needed refills.  5.  I have reviewed the past medical, family, and social history sections including the medications and allergies listed in the above medical record    Nathanial Millman, PA-C, 07/12/2014, 1:14 PM

## 2014-07-13 ENCOUNTER — Telehealth (HOSPITAL_BASED_OUTPATIENT_CLINIC_OR_DEPARTMENT_OTHER): Payer: Self-pay | Admitting: Family Medicine

## 2014-07-13 LAB — RUBELLA IGG ANTIBODY: RUBELLA: 397.8 IU/mL

## 2014-07-13 LAB — HIV ANTIGEN ANTIBODY
HIV 1/2 PLUS O AG/AB SCREEN: NONREACTIVE
HIV RESULT INTERPRETATION: NONREACTIVE

## 2014-07-13 LAB — HEPATITIS B SURFACE ANTIGEN: HEPATITIS B SURFACE ANTIGEN: NONREACTIVE

## 2014-07-13 LAB — CHLAMYDIA GC NAAT
GENPROBE CHLAMYDIA: NEGATIVE
GENPROBE GC: NEGATIVE

## 2014-07-13 LAB — URINE CULTURE

## 2014-07-13 LAB — TREPONEMA PALLIDUM AB IGG: TREPONEMA PALLIDUM AB IgG: NONREACTIVE

## 2014-07-13 LAB — HCG QUANTITATIVE: HCG QUANTITATIVE: 6387 m[IU]/mL — ABNORMAL HIGH (ref 0–4)

## 2014-07-13 LAB — TSH (THYROID STIMULATING HORMONE): TSH (THYROID STIM HORMONE): 5.44 u[IU]/mL — ABNORMAL HIGH (ref 0.358–3.740)

## 2014-07-13 MED ORDER — LEVOTHYROXINE SODIUM 100 MCG PO TABS
100.0000 ug | ORAL_TABLET | Freq: Every day | ORAL | 0 refills | Status: DC
Start: 2014-07-13 — End: 2014-07-15

## 2014-07-13 NOTE — Progress Notes (Signed)
Called home with interpreter and left message for pt re thyroid. TSH was high so I would like to increase her dose of levothyroxine medication to 100 mcg daily (instead of 88 mcg which she is taking now). I sent new dose to her pharmacy. We should recheck her thryoid again in 6 weeks.     Component      Latest Ref Rng 07/12/2014   TSH (THYROID STIM HORMONE)      0.358 - 3.740 uIU/mL 5.440 (H)

## 2014-07-15 ENCOUNTER — Encounter (HOSPITAL_BASED_OUTPATIENT_CLINIC_OR_DEPARTMENT_OTHER): Payer: Self-pay | Admitting: Family Medicine

## 2014-07-15 ENCOUNTER — Other Ambulatory Visit (HOSPITAL_BASED_OUTPATIENT_CLINIC_OR_DEPARTMENT_OTHER): Payer: Self-pay | Admitting: Family Medicine

## 2014-07-15 DIAGNOSIS — Z22330 Carrier of Group B streptococcus: Secondary | ICD-10-CM | POA: Insufficient documentation

## 2014-07-15 DIAGNOSIS — B002 Herpesviral gingivostomatitis and pharyngotonsillitis: Secondary | ICD-10-CM | POA: Insufficient documentation

## 2014-07-15 DIAGNOSIS — Z3481 Encounter for supervision of other normal pregnancy, first trimester: Secondary | ICD-10-CM | POA: Insufficient documentation

## 2014-07-15 DIAGNOSIS — O09529 Supervision of elderly multigravida, unspecified trimester: Secondary | ICD-10-CM | POA: Insufficient documentation

## 2014-07-15 DIAGNOSIS — O99281 Endocrine, nutritional and metabolic diseases complicating pregnancy, first trimester: Principal | ICD-10-CM

## 2014-07-15 DIAGNOSIS — E039 Hypothyroidism, unspecified: Principal | ICD-10-CM

## 2014-07-15 MED ORDER — LEVOTHYROXINE SODIUM 112 MCG PO TABS
112.0000 ug | ORAL_TABLET | Freq: Every day | ORAL | 0 refills | Status: DC
Start: 2014-07-15 — End: 2015-06-19

## 2014-07-15 NOTE — Progress Notes (Signed)
Levo dose adjusted from 100 mcg to 112 mcg daily, representing a 33% increase in early pregnancy.   Meds reconciled with pharmacy.    Problem list updated.    Bindi Klomp MD, 07/15/2014, 10:51 AM

## 2014-07-20 ENCOUNTER — Ambulatory Visit: Payer: Self-pay | Admitting: Family Medicine

## 2014-07-20 DIAGNOSIS — Z3A01 Less than 8 weeks gestation of pregnancy: Secondary | ICD-10-CM

## 2014-07-20 LAB — US OB 1ST TRIMESTER W TV

## 2014-07-21 NOTE — Progress Notes (Signed)
PCP to discuss with pt

## 2014-07-22 ENCOUNTER — Encounter (HOSPITAL_BASED_OUTPATIENT_CLINIC_OR_DEPARTMENT_OTHER): Payer: Self-pay | Admitting: Family Medicine

## 2014-07-22 ENCOUNTER — Ambulatory Visit (HOSPITAL_BASED_OUTPATIENT_CLINIC_OR_DEPARTMENT_OTHER): Payer: Commercial Managed Care - PPO | Admitting: Family Medicine

## 2014-07-22 VITALS — BP 102/60 | HR 76 | Temp 97.4°F | Wt 167.0 lb

## 2014-07-22 DIAGNOSIS — Z319 Encounter for procreative management, unspecified: Secondary | ICD-10-CM

## 2014-07-22 DIAGNOSIS — Z3480 Encounter for supervision of other normal pregnancy, unspecified trimester: Secondary | ICD-10-CM

## 2014-07-22 DIAGNOSIS — Z3481 Encounter for supervision of other normal pregnancy, first trimester: Principal | ICD-10-CM

## 2014-07-22 DIAGNOSIS — J301 Allergic rhinitis due to pollen: Secondary | ICD-10-CM

## 2014-07-22 MED ORDER — PRENATAL VITAMINS 28-0.8 MG PO TABS
1.0000 | ORAL_TABLET | Freq: Every day | ORAL | 3 refills | Status: AC
Start: 2014-07-22 — End: 2015-07-23

## 2014-07-22 MED ORDER — CETIRIZINE HCL 5 MG PO TABS
5.0000 mg | ORAL_TABLET | Freq: Every day | ORAL | 11 refills | Status: AC
Start: 2014-07-22 — End: 2015-07-22

## 2014-07-22 NOTE — Progress Notes (Signed)
S: 34 yo overweight  with hypothyroidism, oral herpes, h/o adjustment d/o with depressed mood, GBS carrier who presents for a routine prenatal visit at 7 weeks 5 days by LMP (dating confirmed by [place] on 07/20/14, 6 weeks 5 days). Low fetal heart rate appreciated on early dating ultrasound. She is feeling well. Denies vaginal bleeding, cramping, pelvic pain, nausea or vomiting.    Re: hypothyroidism, TSH = 5.4 one week ago on her baseline levo dose of 88 mcg. An rx for levo 112 mcg was sent to the pharmacy last week- she has yet to fill it. Needs prenatal vitamins refilled.     Would like to transfer care to [facility].     O:  BP wnl  HEENT: NCAT. PERRLA. EOMI  PSYCH: appropriate behavior, cooperative with exam. Mood is good, full congruent affect. Appropriate thought content. Organized thought process. Good insight and judgement  NEURO: grossly normal    A/P: 34 yo G3P2 at 7 weeks 5 days by LMP (confirmed by 1st TM dating [place]) here for an initial prenatal visit. Pregnancy complicated by AMA, hypothyroidism, overweight, h/o adjustment disorder with depressed mood, GBS carrier. She would like to transfer care to [facility] OB[facility].    []  Hypothyroidism-- to start 112 mcg levothyroxine from 88 mcg today (07/22/14). TSH = 5.4 (07/12/14). Discussed that when she establishes care at [facility] she will need monthly TSH monitoring at least until 20 weeks. Then q trimester betw 26-32 weeks.   []  Low FHR on dating [place]-- discussed need for repeat US to take a better look at the FHR. [place] requisition ordered. Discussed that FD will call her for an appt time  []  1st TM labs-- will defer until she is able to establish with BI.   []  Transfer of care-- ext OB referral ordered. Pt advised to call and schedule intake at [facility] herself. Patient in agreement with plan.    I have spent 40 minutes in face to face time with this patient/patient proxy of which > 50% was in counseling or coordination of care regarding above issues/Dx.

## 2014-07-22 NOTE — Patient Instructions (Signed)
Department of Obstetrics and Gynecology   [facility]   [address]   [place], [place] [address]   [phone]

## 2014-07-28 ENCOUNTER — Telehealth (HOSPITAL_BASED_OUTPATIENT_CLINIC_OR_DEPARTMENT_OTHER): Payer: Self-pay | Admitting: Infectious Diseases

## 2014-07-28 NOTE — Progress Notes (Signed)
Fwd to PCP

## 2014-07-28 NOTE — Progress Notes (Signed)
OB US not resulted in system. I also cannot see a schedule appointment time in EPIC-- was it ever performed? I am forwarding this message back to the front desk to inquire with the radiology dept if/when the repeat ultrasound ordered on 07/22/14 was performed. If it has not been scheduled yet, please schedule the [place] and inform the patient of the time (all locations and times OK per patient)    Willis Modena MD, 07/28/2014, 11:48 AM    t

## 2014-07-28 NOTE — Telephone Encounter (Signed)
-----   Message from [name] sent at 07/28/2014 11:20 AM EDT -----  Regarding: speak to provider  Brenda Braun [id number], 34 year old, female    Calls today:  Clinical Questions (NON-SICK CLINICAL QUESTIONS ONLY)    Name of person calling [facility]  Specific nature of request she would like to speak to PCP re: u/s.  Return phone number ##  Person calling on behalf of patient: Patient (self)    CALL BACK NUMBER: [phone]  Best time to call back:   Cell phone:   Other phone:    Patient's language of care: [place]    Patient needs a [place] interpreter.    Patient's PCP: Willis Modena MD

## 2014-07-29 NOTE — Progress Notes (Signed)
Using [place] interpreter, pt states fine but has nausea. Did have nausea for her other child when she threw up a lot, nausea comes and goes, drinks milk then it comes back again. No vomiting yet.   Looking for anti nausea med to help.     Pt did not know she had to call for u/s. States she has the paper for u/s will call scheduling and schedule u/s asap with [facility]. Advised pt to call [facility] to register as a patient then she can establish care with OB/Gyn there be. We will contact her regarding external referral.

## 2014-07-29 NOTE — Telephone Encounter (Signed)
-----   Message from Glenford Bayley sent at 07/29/2014  4:26 PM EDT -----  Regarding: results   Contact: [phone]  Calls today: Clinical Questions (NON-SICK CLINICAL QUESTIONS ONLY)   Name of person calling [facility]  Specific nature of request she would like to speak to PCP re: u/s. ,and also feels very nauseas ,please advise   Return phone number ##  Person calling on behalf of patient: Patient (self)    CALL BACK NUMBER: [phone]  Best time to call back:   Cell phone:   Other phone:    Patient's language of care: [place]    Patient needs a [place] interpreter.    Patient's PCP: Willis Modena MD

## 2014-08-02 ENCOUNTER — Encounter (HOSPITAL_BASED_OUTPATIENT_CLINIC_OR_DEPARTMENT_OTHER): Payer: Self-pay | Admitting: Family Medicine

## 2014-08-02 ENCOUNTER — Ambulatory Visit (HOSPITAL_BASED_OUTPATIENT_CLINIC_OR_DEPARTMENT_OTHER): Payer: Commercial Managed Care - PPO | Admitting: Family Medicine

## 2014-08-02 ENCOUNTER — Ambulatory Visit (HOSPITAL_BASED_OUTPATIENT_CLINIC_OR_DEPARTMENT_OTHER): Payer: Self-pay | Admitting: Family Medicine

## 2014-08-02 VITALS — BP 110/60 | HR 68 | Temp 98.5°F | Wt 178.0 lb

## 2014-08-02 DIAGNOSIS — O021 Missed abortion: Principal | ICD-10-CM

## 2014-08-02 DIAGNOSIS — Z3A01 Less than 8 weeks gestation of pregnancy: Secondary | ICD-10-CM

## 2014-08-02 LAB — US OB 1ST TRIMESTER W TV

## 2014-08-02 LAB — HCG QUANTITATIVE: HCG QUANTITATIVE: 28875 m[IU]/mL — ABNORMAL HIGH (ref 0–4)

## 2014-08-02 MED ORDER — HYDROCODONE-ACETAMINOPHEN 5-325 MG PO TABS
1.0000 | ORAL_TABLET | Freq: Four times a day (QID) | ORAL | 0 refills | Status: AC | PRN
Start: 2014-08-02 — End: 2014-08-09

## 2014-08-02 NOTE — Progress Notes (Signed)
SUBJECTIVE:  Brenda Braun is a 34 year old female who presents for a f/u. She is accompanied by her husband.    With the following concern(s):    #) Embyronic demise-- missed abortion on US done today which showed a 15 mm CRL with no FHR. She continues to feel nauseous, no vomiting. No pelvic pain or vaginal bleeding to date.     She cannot believe that she is having a miscarriage. This was a desired pregnancy.    ROS:  Gen: denies fever, chills, appetite loss, unintentional weight loss, malaise  Pulm: denies cough, shortness of breath, pleuritic pain  Cardio: denies chest pain, palpitations, orthopnea, paroxysmal nocturnal dyspnea, interval leg swelling  Abd: denies abdominal pain, nausea, vomiting, dysphagia, constipation, or diarrhea  Neuro: denies headache, focal numbness/tingling, weakness, falls    PROBLEM LIST:  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     Restless leg syndrome     Adjustment disorder with anxiety     Overweight (BMI 25.0-29.9)     Biceps tendinitis on left     Flatulence, eructation, and gas pain     GBS carrier     Oral herpes     Encounter for supervision of other normal pregnancy, first trimester     Advanced maternal age in multigravida      MEDICATIONS:    Current Outpatient Prescriptions:  HYDROcodone-acetaminophen (NORCO) 5-325 MG per tablet Take 1 tablet by mouth every 6 (six) hours as needed for Pain Max Daily Amount: 4 tablets Disp: 10 tablet Rfl: 0   cetirizine (ZYRTEC) 5 MG tablet Take 1 tablet by mouth daily Disp: 30 tablet Rfl: 11   Prenatal Vit-Fe Fumarate-FA (PRENATAL VITAMINS) 28-0.8 MG TABS Take 1 tablet by mouth daily Disp: 90 tablet Rfl: 3   levothyroxine (LEVOTHROID) 112 MCG tablet Take 1 tablet by mouth daily Disp: 90 tablet Rfl: 0   acyclovir (ZOVIRAX) 5 % ointment Apply  topically every 3 (three) hours. Disp: 30 g Rfl: 3   cholecalciferol (VITAMIN D3)  2000 UNIT tablet Take 1 tablet by mouth daily. To pharm: pls d/c all other Vit D Rx Disp: 90 tablet Rfl: 3     No current facility-administered medications for this visit.     ALLERGIES:  Review of Patient's Allergies indicates:   Penicillins             Hives    VITALS:  BP 110/60  Pulse 68  Temp 98.5 F (36.9 C) (Temporal)  Wt 80.7 kg (178 lb)  LMP 05/29/2014  SpO2 99%  BMI 29.66 kg/m2  Pain Score: 5 (5/10)    GEN: well-appearing F, first angry then intermittently tearful during interview.  HEENT: NCAT. PERRLA. EOMI  PSYCH: appropriate behavior, cooperative with exam. Mood is good, full congruent affect. Appropriate thought content. Organized thought process. Good insight and judgement  NEURO: grossly normal  GYN: deferred    A/P:  Brenda Braun is a 34 year old female who presents with:    (O02.1) Embryonic demise  (primary encounter diagnosis)  Comment: missed abortion at 7 weeks 5 days on [place], desired pregnancy. Rh +, H&H wnl  Plan:   - discussed expectant management vs med management vs surgical management for missed AB  - opts for expectant management today -- discussed safety of watchful waiting x 2 months  - anticipatory guidance given, ibuprofen 600 mg q  6 hrs, heat prn for cramping advised, HYDROcodone-acetaminophen (NORCO) 5-325 MG per tablet q 4 hrs prn e-prescribed  - she has my direct cell phone # to call in case of emergency (warning signs for when to call and RTC reviewed)  - COLLECTION VENOUS BLOOD VENIPUNCTURE, HCG QUANTITATIVE  - f/u in 1 week, sooner prn    Dispostion:  F/u with Dr Emelda Fear at 4:40 pm on Tues 6/28.    *AVS printed  *Will call patient for results, okay to leave message    I have spent 40 minutes in face to face time with this patient/patient proxy of which > 50% was in counseling or coordination of care regarding above issues/Dx.

## 2014-08-02 NOTE — Patient Instructions (Addendum)
Dr Emelda Fear: [phone]    Call if fever > 100.4 (oral), you are bleeding through 2 pads per hour for 2 hours in a row, or BAD pelvic pain not relieved by ibuprofen 600 mg every 6 hours and HEAT     Chamada de febre> 100,4, voc est sangrando por 2 almofadas por hora para duas horas em uma fila, dor plvica (oral) ou no ADB Aliviado por ibuprofeno 600 mg a cada 6 horas e calor

## 2014-08-02 NOTE — Addendum Note (Signed)
Addended by: JONES, YVETTE on: 08/02/2014 11:17 AM     Modules accepted: Orders

## 2014-08-02 NOTE — Progress Notes (Signed)
Appt scheduled with Dr Emelda Fear for today at 1 pm.    Willis Modena MD, 08/02/2014, 11:44 AM

## 2014-08-03 ENCOUNTER — Telehealth (HOSPITAL_BASED_OUTPATIENT_CLINIC_OR_DEPARTMENT_OTHER): Payer: Self-pay | Admitting: Family Medicine

## 2014-08-03 DIAGNOSIS — O219 Vomiting of pregnancy, unspecified: Principal | ICD-10-CM

## 2014-08-03 MED ORDER — ONDANSETRON HCL 4 MG PO TABS
4.00 mg | ORAL_TABLET | Freq: Three times a day (TID) | ORAL | 0 refills | Status: AC | PRN
Start: 2014-08-03 — End: 2014-08-10

## 2014-08-03 NOTE — Progress Notes (Signed)
Patient reached. She continues to have hope that the pregnancy is viable. We discussed that the HCG has risen suboptimally and that this + the ultrasound is diagnostic of miscarriage. Discussed that it is normal to still have hope, especially because she continues to feel pregnant.     We will continue with expectant management. I will see her back in 1 week. At that time, I will continue to offer her a repeat US to re-evaluate the viability of the pregnancy. She has warning signs for when to call sooner.    Zofran rx e-prescribed to requested pharmacy for nausea.    Natalia Wittmeyer MD, 08/03/2014, 12:26 PM

## 2014-08-04 ENCOUNTER — Telehealth (HOSPITAL_BASED_OUTPATIENT_CLINIC_OR_DEPARTMENT_OTHER): Payer: Self-pay | Admitting: Ambulatory Care

## 2014-08-04 NOTE — Telephone Encounter (Signed)
-----   Message from [name] sent at 08/04/2014  1:25 PM EDT -----  Regarding: results  Brenda Braun [id number], 34 year old, female    Calls today:  Test Results  Test Results Request from Patient    What test result(s) is the patient requesting? u/s  Date testing was done 6/21  Who ordered the test(s) Dr. Emelda Fear  Message routed to RN    Person calling on behalf of patient: Patient (self)    CALL BACK NUMBER: [phone]  Best time to call back:   Cell phone:   Other phone:    Patient's language of care: [place]    Patient needs a [place] interpreter.    Patient's PCP: Willis Modena MD

## 2014-08-05 NOTE — Progress Notes (Signed)
Unable to reach patient x 2. Have left 2 voice messages letting her know to call back if she continues to have questions.    Elgene Coral MD, 08/05/2014, 5:26 PM

## 2014-08-09 ENCOUNTER — Ambulatory Visit (HOSPITAL_BASED_OUTPATIENT_CLINIC_OR_DEPARTMENT_OTHER): Payer: Commercial Managed Care - PPO | Admitting: Family Medicine

## 2014-08-09 VITALS — BP 102/70 | HR 59 | Temp 97.1°F | Wt 177.2 lb

## 2014-08-09 DIAGNOSIS — O021 Missed abortion: Principal | ICD-10-CM

## 2014-08-09 LAB — HCG QUANTITATIVE: HCG QUANTITATIVE: 20032 m[IU]/mL — ABNORMAL HIGH (ref 0–4)

## 2014-08-09 NOTE — Progress Notes (Addendum)
MEDICAL STUDENT [facility] FAMILY MEDICINE CLINIC NOTE    Subjective:   Brenda Braun is a 34 year old female here for follow up of embryonic demise.     A [place] interpreter was used during this encounter due to language barrier.     #Embryonic demise   - Reports feeling some cramps, not too heavy, having some back pain   - No sign of bleeding at all, last time she had any bleeding was April 17   - Reports nausea, today had vomiting x2, some diarrhea today  - Reports she feels the baby "moving around, like a butterfly, very slight"   - Requesting files today for consultation at the [facility] on Friday   - Reports she is "hopeful" after reading things on the internet about women having a healthy baby even with unable to hear the heart beat   - Husband is concerned that maybe this is not a pregnancy - wondering if this is a myoma instead   - Worried to do anything in case the pregnancy is viable - she wishes that the ultrasound technician could have explained what they were seeing at the time of the ultrasound     ROS  Otherwise negative except as noted above.     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     Restless leg syndrome     Adjustment disorder with anxiety     Overweight (BMI 25.0-29.9)     Biceps tendinitis on left     Flatulence, eructation, and gas pain     GBS carrier     Oral herpes     Encounter for supervision of other normal pregnancy, first trimester     Advanced maternal age in multigravida      MEDS:    Current Outpatient Prescriptions:     ondansetron (ZOFRAN) 4 MG tablet, Take 1 tablet by mouth every 8 (eight) hours as needed for Nausea, Disp: 30 tablet, Rfl: 0    HYDROcodone-acetaminophen (NORCO) 5-325 MG per tablet, Take 1 tablet by mouth every 6 (six) hours as needed for Pain Max Daily Amount: 4 tablets, Disp: 10 tablet, Rfl: 0    cetirizine (ZYRTEC) 5 MG tablet,  Take 1 tablet by mouth daily, Disp: 30 tablet, Rfl: 11    Prenatal Vit-Fe Fumarate-FA (PRENATAL VITAMINS) 28-0.8 MG TABS, Take 1 tablet by mouth daily, Disp: 90 tablet, Rfl: 3    levothyroxine (LEVOTHROID) 112 MCG tablet, Take 1 tablet by mouth daily, Disp: 90 tablet, Rfl: 0    acyclovir (ZOVIRAX) 5 % ointment, Apply  topically every 3 (three) hours., Disp: 30 g, Rfl: 3    cholecalciferol (VITAMIN D3) 2000 UNIT tablet, Take 1 tablet by mouth daily. To pharm: pls d/c all other Vit D Rx, Disp: 90 tablet, Rfl: 3    Review of Patient's Allergies indicates:   Penicillins             Hives    Objective:  BP 102/70  Pulse 59  Temp 97.1 F (36.2 C) (Temporal)  Wt 80.4 kg (177 lb 3.2 oz)  LMP 05/29/2014  SpO2 99%  BMI 29.52 kg/m2  GEN:  Well appearing, NAD  HEENT:  PERRLA, sclera non-injected, supple and no adenopathy.  Oropharynx is normal and without exudate.  HEART:  RRR, no murmurs, rubs or gallops.  PULM: Lungs clear to auscultation bilaterally, no wheezes, crackles  or rhonchi.   ABD: abdomen is soft without significant tenderness, masses, organomegaly or guarding  EXT: normal, no cyanosis, clubbing and no edema  NEURO: AO x 3. CN grossly intact. Gait normal. Sensation grossly normal.     Assessment and Plan:  Brenda RootsDaniele Braun is a 34 year old female here for follow up of embryonic demise.     (O02.1) Embryonic demise  (primary encounter diagnosis)  (O02.1) Missed abortion  Comment: Discussed with patient again the results of the ultrasound from last week - that there was a CRL of 1.75mm but no fetal heart beat detected.   Discussed that she is still having the signs of pregnancy because her bhcg levels are still elevated.   Given that she is still feeling "pregnant" she reported that she wants to be sure that this pregnancy is not viable before doing anything.   - Discussed that today we will get a bhcg level to look at her trend which will help us see how her pregnancy is  - She would like a repeat ultrasound to  confirm fetal demise - would like to have the ultrasound technician tell her and her husband what they are seeing at the time so they know what the technician is talking about   - Follow up with Dr. Emelda [facility]Brahver at [facility] next week to discuss ultrasound results   - Came to the conclusion that [facility] - will be canceling appt.   Plan: COLLECTION VENOUS BLOOD VENIPUNCTURE, HCG         QUANTITATIVE, US PREGNANT UTERUS 14 WK         TRANSABDL 1/1ST GESTAT      I have reviewed the past medical, surgical, social and family history and updated these sections of EpicCare as relevant. All interim labs, test results, and consult notes were reviewed and discussed with Brenda Braun. Medications were reconciled during this visit and a current medication list was given to the patient at the end of the visit.    We discussed the patients current medications. The patient expressed understanding and no barriers to adherence were identified.     For next time:   []  [facility]     Discussed with Dr. Joice LoftsBrahver     Edgard Debord, 08/09/2014, 4:36 PM

## 2014-08-09 NOTE — Progress Notes (Signed)
Subjective:   Brenda Braun is a 34 year old female here for follow up of embryonic demise.     A [place] interpreter was used during this encounter due to language barrier.     #Embryonic demise   - Reports feeling some cramps, not too heavy, having some back pain   - No sign of bleeding at all, last time she had any bleeding was April 17   - Reports nausea, today had vomiting x2, some diarrhea today  - Reports she feels the baby "moving around, like a butterfly, very slight"   - Requesting files today for consultation at the [facility] on Friday   - Reports she is "hopeful" after reading things on the internet about women having a healthy baby even with unable to hear the heart beat   - Husband is concerned that maybe this is not a pregnancy - wondering if this is a myoma instead   - Worried to do anything in case the pregnancy is viable - she wishes that the ultrasound technician could have explained what they were seeing at the time of the ultrasound     ROS  Otherwise negative except as noted above.     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, antepartum  Pap smear abnormality of cervix with ASCUS favoring benign  Restless leg syndrome  Adjustment disorder with anxiety  Overweight (BMI 25.0-29.9)  Biceps tendinitis on left  Flatulence, eructation, and gas pain  GBS carrier  Oral herpes  Encounter for supervision of other normal pregnancy, first trimester  Advanced maternal age in multigravida      MEDS:    Current Outpatient Prescriptions:    ondansetron (ZOFRAN) 4 MG tablet, Take 1 tablet by mouth every 8 (eight) hours as needed for Nausea, Disp: 30 tablet, Rfl: 0   HYDROcodone-acetaminophen (NORCO) 5-325 MG per tablet, Take 1 tablet by mouth every 6 (six) hours as needed for Pain Max Daily Amount: 4 tablets, Disp: 10 tablet, Rfl: 0   cetirizine (ZYRTEC) 5 MG tablet, Take 1 tablet by mouth daily, Disp: 30 tablet, Rfl: 11   Prenatal Vit-Fe Fumarate-FA (PRENATAL  VITAMINS) 28-0.8 MG TABS, Take 1 tablet by mouth daily, Disp: 90 tablet, Rfl: 3   levothyroxine (LEVOTHROID) 112 MCG tablet, Take 1 tablet by mouth daily, Disp: 90 tablet, Rfl: 0   acyclovir (ZOVIRAX) 5 % ointment, Apply topically every 3 (three) hours., Disp: 30 g, Rfl: 3   cholecalciferol (VITAMIN D3) 2000 UNIT tablet, Take 1 tablet by mouth daily. To pharm: pls d/c all other Vit D Rx, Disp: 90 tablet, Rfl: 3    Review of Patient's Allergies indicates:  Penicillins Hives    Objective:  BP 102/70 Pulse 59 Temp 97.1 F (36.2 C) (Temporal) Wt 80.4 kg (177 lb 3.2 oz) LMP 05/29/2014 SpO2 99% BMI 29.52 kg/m2  GEN: Well appearing, NAD  HEENT: PERRLA, sclera non-injected, supple and no adenopathy. Oropharynx is normal and without exudate.  HEART: RRR, no murmurs, rubs or gallops.  PULM: Lungs clear to auscultation bilaterally, no wheezes, crackles or rhonchi.   ABD: abdomen is soft without significant tenderness, masses, organomegaly or guarding  EXT: normal, no cyanosis, clubbing and no edema  NEURO: AO x 3. CN grossly intact. Gait normal. Sensation grossly normal.     Assessment and Plan:  Brenda Braun is a 34 year old female here for follow up of embryonic demise.     (O02.1) Embryonic demise (primary encounter diagnosis)  (O02.1) Missed abortion  Comment: Discussed with patient again the results of the ultrasound from last week - that there was a CRL of 1.195mm but no fetal heart beat detected.   Discussed that she is still having the signs of pregnancy because her bhcg levels are still elevated.   Given that she is still feeling "pregnant" she reported that she wants to be sure that this pregnancy is not viable before doing anything.   - Discussed that today we will get a bhcg level to look at her trend which will help us see how her pregnancy is  - She would like a repeat ultrasound to confirm fetal demise - would like to have the ultrasound technician tell her and her husband what they are seeing at the  time so they know what the technician is talking about   - Follow up with [facility]ver [facility] next week to discuss ultrasound results   - Came to the conclusion that she does not need [facility] - will be canceling appt.   Plan: COLLECTION VENOUS BLOOD VENIPUNCTURE, HCG   QUANTITATIVE, US PREGNANT UTERUS 14 WK   TRANSABDL 1/1ST GESTAT      I have reviewed the past medical, surgical, social and family history and updated these sections of EpicCare as relevant. All interim labs, test results, and consult notes were reviewed and discussed with Suzi Rootsaniele Schwartzkopf. Medications were reconciled during this visit and a current medication list was given to the patient at the end of the visit.    We discussed the patients current medications. The patient expressed understanding and no barriers to adherence were identified.     For next time:   []  Follow up results of [facility]     I have spent 40 minutes in face to face time with this patient/patient proxy of which > 50% was in counseling or coordination of care regarding above issues/Dx.

## 2014-08-12 ENCOUNTER — Telehealth (HOSPITAL_BASED_OUTPATIENT_CLINIC_OR_DEPARTMENT_OTHER): Payer: Self-pay | Admitting: Family Medicine

## 2014-08-12 ENCOUNTER — Ambulatory Visit: Payer: Self-pay | Admitting: Family Medicine

## 2014-08-12 DIAGNOSIS — O021 Missed abortion: Principal | ICD-10-CM

## 2014-08-12 LAB — US OB 1ST TRIMESTER W TV

## 2014-08-12 NOTE — Addendum Note (Signed)
Addended byMyer Peer: CEZAIRE, ROLDY on: 08/12/2014 03:40 PM     Modules accepted: Orders

## 2014-08-12 NOTE — Progress Notes (Signed)
Called patient with interpreter and discussed results of ultrasound confirming fetal demise at 7.4weeks.  She has had no bleeding, pain, fever or discharge. We discussed management options including expectant management, medical management and aspiration with local or IV sedation and she is not yet sure which she prefers. [facility] which she will keep and she will call us sooner if any heavy bleeding, pain or other concerns. Patient voices understanding, agrees with the plan and has had all questions answered.

## 2014-08-16 ENCOUNTER — Telehealth (HOSPITAL_BASED_OUTPATIENT_CLINIC_OR_DEPARTMENT_OTHER): Payer: Self-pay

## 2014-08-16 ENCOUNTER — Encounter (HOSPITAL_BASED_OUTPATIENT_CLINIC_OR_DEPARTMENT_OTHER): Payer: Self-pay | Admitting: Emergency Medicine

## 2014-08-16 ENCOUNTER — Emergency Department (HOSPITAL_BASED_OUTPATIENT_CLINIC_OR_DEPARTMENT_OTHER)
Admission: RE | Admit: 2014-08-16 | Disposition: A | Discharge status: Home / Self Care | Payer: Self-pay | Source: Emergency Department | Attending: Emergency Medicine | Admitting: Emergency Medicine

## 2014-08-16 ENCOUNTER — Ambulatory Visit (HOSPITAL_BASED_OUTPATIENT_CLINIC_OR_DEPARTMENT_OTHER): Payer: Commercial Managed Care - PPO

## 2014-08-16 LAB — URINALYSIS
BILIRUBIN, URINE: NEGATIVE
CASTS: NONE SEEN PER LPF
CRYSTALS: NONE SEEN
GLUCOSE, URINE: NEGATIVE MG/DL
KETONE, URINE: NEGATIVE MG/DL
NITRITE, URINE: NEGATIVE
PH URINE: 6 (ref 5.0–8.0)
PROTEIN, URINE: NEGATIVE MG/DL
SPECIFIC GRAVITY URINE: 1.015 (ref 1.003–1.035)
SQUAMOUS EPITHELIAL CELLS: >10 PER LPF — AB (ref 0–4)

## 2014-08-16 LAB — CBC, PLATELET & DIFFERENTIAL
ABSOLUTE BASO COUNT: 0 10*3/uL (ref 0.0–0.1)
ABSOLUTE EOSINOPHIL COUNT: 0.2 10*3/uL (ref 0.0–0.8)
ABSOLUTE IMM GRAN COUNT: 0.01 10*3/uL (ref 0.00–0.03)
ABSOLUTE LYMPH COUNT: 1.2 10*3/uL (ref 0.6–5.9)
ABSOLUTE MONO COUNT: 0.5 10*3/uL (ref 0.2–1.4)
ABSOLUTE NEUTROPHIL COUNT: 4.7 10*3/uL (ref 1.6–8.3)
BASOPHIL %: 0.3 % (ref 0.0–1.2)
EOSINOPHIL %: 2.7 % (ref 0.0–7.0)
HEMATOCRIT: 39.6 % (ref 34.1–44.9)
HEMOGLOBIN: 14 g/dL (ref 11.2–15.7)
IMMATURE GRANULOCYTE %: 0.2 % (ref 0.0–0.4)
LYMPHOCYTE %: 17.9 % (ref 15.0–54.0)
MEAN CORP HGB CONC: 35.4 g/dL (ref 31.0–37.0)
MEAN CORPUSCULAR HGB: 30.3 pg (ref 26.0–34.0)
MEAN CORPUSCULAR VOL: 85.7 fL (ref 80.0–100.0)
MEAN PLATELET VOLUME: 10.2 fL (ref 8.7–12.5)
MONOCYTE %: 8 % (ref 4.0–13.0)
NEUTROPHIL %: 70.9 % (ref 40.0–75.0)
PLATELET COUNT: 215 10*3/uL (ref 150–400)
RBC DISTRIBUTION WIDTH STD DEV: 41 fL (ref 35.1–46.3)
RBC DISTRIBUTION WIDTH: 13.2 % (ref 11.5–14.3)
RED BLOOD CELL COUNT: 4.62 M/uL (ref 3.90–5.20)
WHITE BLOOD CELL COUNT: 6.6 10*3/uL (ref 4.0–11.0)

## 2014-08-16 LAB — COMPREHENSIVE METABOLIC PANEL
ALANINE AMINOTRANSFERASE: 75 U/L — ABNORMAL HIGH (ref 12–45)
ALBUMIN: 3.4 g/dL (ref 3.4–5.0)
ALKALINE PHOSPHATASE: 51 U/L (ref 45–117)
ANION GAP: 8 mmol/L (ref 5–15)
ASPARTATE AMINOTRANSFERASE: 36 U/L — ABNORMAL HIGH (ref 8–34)
BILIRUBIN TOTAL: 0.4 mg/dL (ref 0.2–1.0)
BUN (UREA NITROGEN): 8 mg/dL (ref 7–18)
CALCIUM: 8.5 mg/dL (ref 8.5–10.1)
CARBON DIOXIDE: 26 mmol/L (ref 21–32)
CHLORIDE: 105 mmol/L (ref 98–107)
CREATININE: 0.8 mg/dL (ref 0.4–1.2)
ESTIMATED GLOMERULAR FILT RATE: >60 mL/min (ref >60)
Glucose Random: 94 mg/dL (ref 74–160)
POTASSIUM: 3.8 mmol/L (ref 3.5–5.1)
SODIUM: 139 mmol/L (ref 136–145)
TOTAL PROTEIN: 7.1 g/dL (ref 6.4–8.2)

## 2014-08-16 LAB — HCG QUANTITATIVE: HCG QUANTITATIVE: 9621 m[IU]/mL — ABNORMAL HIGH (ref 0–4)

## 2014-08-16 LAB — HOLD BLUE TOP TUBE

## 2014-08-16 LAB — US ABDOMEN PELVIS SCROTUM & OR RETROPERITONEUM DOPPLER COMPLETE

## 2014-08-16 LAB — LIPASE: LIPASE: 107 U/L (ref 73–393)

## 2014-08-16 LAB — VAGINITIS PANEL (DNA PROBE)
CANDIDA SPECIES: NEGATIVE
GARDNERELLA VAGINALIS: NEGATIVE
TRICHOMONAS VAGINALIS: NEGATIVE

## 2014-08-16 LAB — LACTIC ACID: LACTIC ACID: 1.8 mmol/L (ref 0.4–2.0)

## 2014-08-16 LAB — US OB 1ST TRIMESTER W TV

## 2014-08-16 MED ORDER — ONDANSETRON HCL 4 MG/2ML IJ SOLN
4.00 mg | Freq: Once | INTRAMUSCULAR | Status: AC
Start: 2014-08-16 — End: 2014-08-16
  Administered 2014-08-16: 4 mg via INTRAVENOUS
  Filled 2014-08-16: qty 2

## 2014-08-16 MED ORDER — ACETAMINOPHEN 325 MG PO TABS
975.00 mg | ORAL_TABLET | Freq: Once | ORAL | Status: AC
Start: 2014-08-16 — End: 2014-08-16
  Administered 2014-08-16: 975 mg via ORAL
  Filled 2014-08-16: qty 3

## 2014-08-16 MED ORDER — SODIUM CHLORIDE 0.9 % IV BOLUS
1000.0000 mL | Freq: Once | INTRAVENOUS | Status: AC
  Administered 2014-08-16: 1000 mL via INTRAVENOUS

## 2014-08-16 NOTE — Narrator Note (Signed)
PA at bedside for pelvic w/ PAR.

## 2014-08-16 NOTE — ED Triage Note (Signed)
This AM, felt tactile fever, chills, + N, diarrhea x 1. Called clinic, told to come in  + cramps + vaginal discharge: egg white. Sun; small amt bleeding  Fri: no heart beat- fetal demise

## 2014-08-16 NOTE — Narrator Note (Signed)
In US 

## 2014-08-16 NOTE — Discharge Instructions (Signed)
What we did in the Emergency Department (ED):  - Exam and History   -blood work and urinalysis  -ultrasound  --cultures are pending      Next steps:  - Follow up with your doctor tomorrow at 430 pm as scheduled.    Come back to the Emergency Department (ED) for:  Return to the emergency department with any worsening, any concerning symptoms or if unable to obtain follow-up. Worsening signs and symptoms include but are not limited to worsening pain or pain that changes location, fever, vomiting such that you cannot keep down liquids/medications, chest pain, shortness of breath, fainting, seizures, weakness, or any other new or concerning symptoms    Thank you for your patience.

## 2014-08-16 NOTE — Progress Notes (Signed)
Called patient with interpreter.  34 yo with embryonic demise at 4043w4d scheduled for aspiration procedure with me tomorrow evening.  Confirmed symptoms that she reviewed with the nurse.  Tmax was actually 38.5 (101.62F) at 9am and she complains of chills, weakness, muscle aches, mild abdominal cramping. Explained that given her fever I'd like her to be seen earlier to be evaluated for septic abortion / endometritis. Re[facility], report called to ED attending Dr. Jeanella CrazePierce.

## 2014-08-16 NOTE — Narrator Note (Signed)
Patient Disposition    Patient education for diagnosis, medications, activity, diet and follow-up.  Patient left ED 8:07 PM.  Patient rep received written instructions.  Interpreter to provide instructions: Yes    Patient belongings with patient: YES    Have all existing LDAs been addressed? Yes    Have all IV infusions been stopped? Yes    Discharged to: Discharged to home w/ instructions to f/u w/ ob tomorrow as planned.  PT left w/o concerns or questions.

## 2014-08-16 NOTE — ED Provider Notes (Signed)
eMERGENCY dEPARTMENT Physician Assistant NOTE    The ED nursing record was reviewed.   The prior medical records as available electronically through Epic were reviewed.  The mode of arrival was Self on 08/16/2014  2:55 PM.  A [place] interpreter was used.  This patient was seen with Emergency Department attending physician Dr. Hester Mates    CHIEF COMPLAINT    Patient presents with:  VERIFY CHIEF COMPLAINT: PREGNANT EXPECT      HPI    Brenda Braun is a 34 year old with reported embryonic demise at [redacted]w[redacted]d identified by ultrasound last week presenting directed by her OB after reporting one day of fevers, chills, body aches, some cramping lower abdominal pains.  Here she admits to some white vaginal discharge yesterday, which was preceded by some vaginal bleeding, nausea, some hoarse voice as well, states her urine is dark yellow and malodorous.  Denies any other complaints cleaned vomiting, headache or neck pain, chest pain, shortness of breath, rash, dizziness, weakness, confusion.  Reports she took Tylenol earlier today with moderate relief.  Denies other complaints.  Denies sudden severe history.  Denies recent trauma or illness.      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     Current Outpatient Prescriptions:     cetirizine (ZYRTEC) 5 MG tablet, Take 1 tablet by mouth daily, Disp: 30 tablet, Rfl: 11    Prenatal Vit-Fe Fumarate-FA (PRENATAL VITAMINS) 28-0.8 MG TABS, Take 1 tablet by mouth daily, Disp: 90 tablet, Rfl: 3    levothyroxine (LEVOTHROID) 112 MCG tablet, Take 1 tablet by mouth daily, Disp: 90 tablet, Rfl: 0    acyclovir (ZOVIRAX) 5 % ointment, Apply  topically every 3 (three) hours., Disp: 30 g, Rfl: 3    cholecalciferol (VITAMIN D3) 2000 UNIT tablet, Take 1 tablet by mouth daily. To pharm: pls d/c all other Vit D Rx, Disp: 90 tablet, Rfl: 3    ALLERGIES    Review of Patient's Allergies indicates:   Penicillins             Hives    FAMILY HISTORY      Family History    Cancer - Breast FamHxNeg     Heart Disease FamHxNeg     Stroke Father 89    Comment: x 2    Diabetes         SOCIAL HISTORY    Social History    Marital  status: Married             Spouse name:                       Years of education:                 Number of children:               Social History Main Topics    Smoking status: Never Smoker                                                                Sexual activity: Yes               Partners with: Female       Birth control/protection: IUD    Social History Narrative    Moved from [place] 6 months ago.    Lives with husband and children 7yo and 36 yo sons.    No working.     Student english.    Feels safe at home.        REVIEW OF SYSTEMS     The pertinent positives are reviewed in the HPI above. All other systems were reviewed and are negative.    PHYSICAL EXAM      Vital Signs: BP 116/68  Pulse 66  Temp 99 F  Resp 18  LMP 05/29/2014  SpO2 100%     Constitutional: Well-developed, Well-nourished, Non-toxic appearance. Speaking full sentences.  HEAD: Without signs of trauma. No soft tissue swelling or tenderness.   NECK: Supple.  EYES: Pupils are equal and reactive. Extraocular movements are  intact.  ENT:  Oropharynx clear, airway patent. Mucus membranes are moist.   CV: RRR, No MRG, radial pulses 2+ B/L  PULMONARY:  CTAB, No WRR  ABDOMINAL: Soft, NTND, No palpable organomegaly or mass. No CVA tenderness.    PELVIC: No external bleeding, dc or lesions. No CMT, midline or adnexal tenderness. Small amount clear discharge. Swabs sent. No bleeding or tissue. Female staff chaperone present throughout.   MUSCULOSKELETAL : Moving all 4 extremities. Ambulatory w/ a steady gait.  SKIN: Warm and dry, no rash . The skin color and turgor are normal.   NEUROLOGIC: Normal mental status. Cranial nerves 2-12, motor, sensor, and cerebellar function are grossly intact.  PSYCHIATRIC: Normal affect      RESULTS  No results found for this visit on 08/16/14 (from the past 24 hour(s)).         MEDICATIONS ADMINISTERED ON THIS VISIT  No orders of the defined types were placed in this encounter.      ED COURSE & MEDICAL DECISION MAKING      I reviewed the patient's past medical history/problem list, past surgical history, medication list, social history and allergies.    ED Decision Making & Course: Pt is a 34 year old female with above exam and HPI. Pt afebrile here and very well appearing.   Labs and US ordered. Pt given IV fluid and zofran.     Ultrasound reveals findings consistent still with intrauterine fetal demise.  Urinalysis reveals some trace leukocyte esterase, few white blood cells and red blood cells, squamous cells.  Urine culture is pending.   Patient's serum hCG  has decreased from [phone].  CBC results normal.     Pt on re-eval is asymptomatic and requesting DC.     D/w on call OB. States pt has f/u appt tomorrow and reccs no abx at this time.     Pt stable for dc home. To f/u with OB tomorrow and return as needed.    Patient given discharge instructions including return precautions. They express understanding and agreement with the plan of care all questions answered here.     Diagnosis: Missed  abortion  Body aches    Disposition: Discharged home in stable condition                                                                                                               Billee Cashing, PA-C  [facility]  Department of Emergency Medicine      This record was generated in real time using voice recognition software. I proof-read and corrected any voice recognitions errors found. Please excuse any remaining voice recognition errors which were not detected.

## 2014-08-16 NOTE — ED Provider Notes (Addendum)
This patient was evaluated in the ED for fever/abdominal cramping, although initial history and physical exam information was obtained by the PA Hodgens, who also made a record of this visit. I independently examined and evaluated this patient and made all diagnostic, treatment, and disposition decisions.    Physical examination  Vital signs: BP 116/68  Pulse 66  Temp 99 F  Resp 18  LMP 05/29/2014  SpO2 100%    Constitutional:  No acute distress, non-toxic appearance   Eyes: Normal lids/lashes, sclerae anicteric  HENT:  Atraumatic, external ears normal, nose normal, oropharynx moist  Respiratory:  No respiratory distress, normal breath sounds, no rales, no wheezing   Cardiovascular:  Normal rate, regular rhythm, no murmurs  GI:  Soft, nondistended, non-tender  GU:  See PA note.  Musculoskeletal:  No edema, no tenderness, no deformities  Integument:  Well hydrated, no rash   Neurologic:  Alert, awake, interactive.  Cranial nerves, motor, sensory grossly intact.  Psychiatric:  Speech and behavior appropriate    Labs Reviewed   URINALYSIS - Abnormal; Notable for the following:     OCCULT BLOOD, URINE TRACE (*)     LEUKOCYTE ESTERASE TRACE (*)     MICROSCOPIC SEE RESULTS (*)     RED BLOOD CELLS URINE FEW 3-4 (*)     BACTERIA 10-50 (*)     SQUAMOUS EPITHELIAL CELLS >10 (*)     All other components within normal limits   COMPREHENSIVE METABOLIC PANEL - Abnormal; Notable for the following:     ASPARTATE AMINOTRANSFERASE 36 (*)     ALANINE AMINOTRANSFERASE 75 (*)     All other components within normal limits   HCG QUANTITATIVE - Abnormal; Notable for the following:     HCG QUANTITATIVE 9621 (*)     All other components within normal limits   CBC + PLT + AUTO DIFF   LACTIC ACID    Narrative:     Emergency RN: By default, ED lactic acid orders are  repeated 3 hours later. If the initial value is n  ormal, check with the provider as the repeat may be  canceled.   LIPASE   HOLD BLUE TOP TUBE   LACTIC ACID   VAGINITIS PANEL  (DNA PROBE)   AMPLIFIED GENPROBE CHLAM/GC    Narrative:     Source Details->genital   BLOOD CULTURE, ADULT    Narrative:     Is the patient currently on antibiotics?->No   BLOOD CULTURE, ADULT    Narrative:     Is the patient currently on antibiotics?->No   URINE CULTURE    Narrative:     Source Details->urethra         Emergency Department course and medical decision-making:  Patient is a 34 year old female seen in the emergency department with concern for fever/abdominal cramping following miscarriage.  Laboratory evaluation remarkable for urinalysis positive for few red blood cells, bacteria, squamous epithelial cells, elevated AST of 36, elevated ALT of 75, quantitative hCG of 9621, urinalysis positive for trace leuk esterase, few red blood cells, bacteria, squamous epithelial cells.  Pelvic ultrasound noted.  Case was discussed with OB/GYN. Plan to have patient follow up as outpatient scheduled appointment.  Patient stable for discharge home.    Impression:  Missed abortion  Body aches    Electronically signed by: Tally JoeJoseph A. Sergi Gellner, MD      This Emergency Department patient encounter note was created using voice-recognition software and in real time during the ED visit. Please excuse any  typographical errors that have not been edited out.

## 2014-08-16 NOTE — Progress Notes (Signed)
TC to this pt after Dr. Emelda FearBrahver who is on vacation called to say that the pt has been trying to contact her. The pt had a fetal demise and has an [facility] with Dr. Joaquin MusicMacnaughton. She states that last night she developed chills ,took Tylenol last night and again at 8:30AM today. She checked her temp a couple of hours later and it was 100.4. She's also c/o'ing of body aches,small amt of lower abdominal cramping and concentrated urine. I encouraged her to increase her fluid intake amnd I scheduled an appt for her to be seen this evening. Pt encouraged to call back if sx's worsen.

## 2014-08-16 NOTE — ED Notes (Signed)
Bed: 12  Expected date: 08/16/14  Expected time:   Means of arrival:   Comments:  Harrel CarinaFernanes, Cassia 34 y/o F, had miscarraige, now fever, chills, weakness, ?endometritis vs. Septic abortion

## 2014-08-17 ENCOUNTER — Ambulatory Visit (HOSPITAL_BASED_OUTPATIENT_CLINIC_OR_DEPARTMENT_OTHER): Payer: Commercial Managed Care - PPO | Admitting: Family Medicine

## 2014-08-17 VITALS — BP 110/64 | HR 72 | Temp 98.5°F

## 2014-08-17 DIAGNOSIS — O039 Complete or unspecified spontaneous abortion without complication: Principal | ICD-10-CM

## 2014-08-17 LAB — URINE CULTURE

## 2014-08-17 LAB — CHLAMYDIA GC NAAT
GENPROBE CHLAMYDIA: NEGATIVE
GENPROBE GC: NEGATIVE

## 2014-08-17 NOTE — Progress Notes (Signed)
[facility]  Office Visit Note   No chief complaint on file.     Subjective:   Brenda Braun is a 34 year old female patient who presents today for miscarriage f/up.    Went to ER with complaint of fever - had negative U/A, CBC, uctx in process, sono with redemonstrated SAB- 8wga with no CA, not c/w LMP dating of 11wga  Currently with URI symptoms- nasal congestion, watery eyes, low grade fever  No vaginal bleeding  Some cramping, some nausea and hunger -feels like she is still pregnant      Review of Systems   Constitutional: + fever at home, afebrile at ER and in office  Respiratory: + URI sx as above    Cardiovascular: Negative.     Gastrointestinal: +cramping   Genitourinary: Negative.    Musculoskeletal: Negative.    Skin: Negative.    Psychiatric/Behavioral: +lots of anxiety    I have reviewed the past medical, surgical, family and social history.    Objective:   BP 110/64  Pulse 72  Temp 98.5 F (36.9 C)  LMP 05/29/2014  Physical Exam   Psychiatric: Her mood appears anxious.   Tearful         Assessment & Plan:    (O03.9) Spontaneous abortion  Comment: low grade fevers explained by current URI, no WBC/neutrophilia on labs of last night and no other risk factors for intrauterine infection so I am not suspicious of septic Ab at this time.  Discussed with patient options for miscarriage management as expectant management for last 2 weeks without expulsion, and they choose D&C under sedation.    Plan: Will refer to Ob/gyn for add-on D&C, pls see H&P section for further documentation.    I have spent 25 minutes in face to face time with this patient/patient proxy of which > 50% was in counseling or coordination of care regarding above issues/Dx.    Horatio Pelari Benbasset-Miller, MD, 08/17/2014, 5:35 PM            Horatio Pelari Benbasset-Miller, M.D.  08/17/2014

## 2014-08-21 NOTE — Addendum Note (Signed)
Addended by: Westley FootsBENBASSET-MILLER, Ahren Pettinger S on: 08/21/2014 10:45 PM     Modules accepted: Orders, SmartSet

## 2014-08-21 NOTE — H&P (Signed)
SUBJECTIVE:  Brenda Braun is a 34 year old female who is here for a preoperative physical.  She has an upcoming D&C surgery.   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     Restless leg syndrome     Adjustment disorder with anxiety     Overweight (BMI 25.0-29.9)     Biceps tendinitis on left     Flatulence, eructation, and gas pain     GBS carrier     Oral herpes     Encounter for supervision of other normal pregnancy, first trimester     Advanced maternal age in multigravida      Obstetric History:  G3P2    Allergy History:  Penicillins    Alcohol, Tobacco and Drug History:    Social History Main Topics   Smoking status: Never Smoker    Smokeless tobacco: Not on file    Alcohol use No    Drug use: No    Sexual activity: Yes    Partners: Male    Birth control/ protection: IUD        Current Outpatient Prescriptions:  cetirizine (ZYRTEC) 5 MG tablet Take 1 tablet by mouth daily Disp: 30 tablet Rfl: 11   Prenatal Vit-Fe Fumarate-FA (PRENATAL VITAMINS) 28-0.8 MG TABS Take 1 tablet by mouth daily Disp: 90 tablet Rfl: 3   levothyroxine (LEVOTHROID) 112 MCG tablet Take 1 tablet by mouth daily Disp: 90 tablet Rfl: 0   acyclovir (ZOVIRAX) 5 % ointment Apply  topically every 3 (three) hours. Disp: 30 g Rfl: 3   cholecalciferol (VITAMIN D3) 2000 UNIT tablet Take 1 tablet by mouth daily. To pharm: pls d/c all other Vit D Rx Disp: 90 tablet Rfl: 3     No current facility-administered medications for this visit.       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, antepartum     Pap smear abnormality of cervix with ASCUS favoring benign     Restless leg syndrome     Adjustment disorder with anxiety     Overweight (BMI 25.0-29.9)     Biceps tendinitis on left     Flatulence, eructation, and gas pain     GBS carrier     Oral herpes     Encounter for supervision of other normal pregnancy, first trimester     Advanced maternal age in multigravida      REVIEW OF SYSTEMS:   Skin: negative   Eyes: negative   Ears/Nose/Throat: negative   Respiratory: negative   Cardiovascular: negative   Gastrointestinal: negative   Genitourinary: negative   Musculoskeletal: negative   Neurologic: negative   Psychiatric: negative   Hematologic/Lymphatic/Immunologic: negative   Endocrine: negative  OBJECTIVE:  BP 110/64  Pulse 72  Temp 98.5 F (36.9 C)  LMP 05/29/2014  Pain Score: Data Unavailable         General appearance: healthy, alert, well developed, well nourished  Head: Normocephalic. No masses, lesions, tenderness or abnormalities  Eyes: conjunctivae/corneas clear. EOM's intact.  Ears: External ears normal.   Nose/Sinuses: Nares normal. Septum midline. Mucosa normal. No drainage or sinus tenderness.  Oropharynx: Lips, mucosa, and tongue normal. Teeth and gums normal. Oropharynx moist and without lesion  Neck: Neck supple.   Lungs:  Good diaphragmatic excursion.   Neuro: Gait normal. Sensation grossly normal       ASSESSMENT and PLAN:  SAB at 8wga  Plan for [facility] under sedation on 7/10  Plan discussed with Dr. Allyson Sabal  _____________  Horatio Pel, MD

## 2014-08-22 ENCOUNTER — Ambulatory Visit (HOSPITAL_BASED_OUTPATIENT_CLINIC_OR_DEPARTMENT_OTHER)
Admit: 2014-08-22 | Disposition: A | Discharge status: Home / Self Care | Payer: Self-pay | Source: Ambulatory Visit | Attending: Obstetrics & Gynecology | Admitting: Obstetrics & Gynecology

## 2014-08-22 ENCOUNTER — Encounter (HOSPITAL_BASED_OUTPATIENT_CLINIC_OR_DEPARTMENT_OTHER): Payer: Self-pay | Admitting: Obstetrics & Gynecology

## 2014-08-22 LAB — BLOOD CULTURE ADULT
AEROBIC BOTTLE, BLOOD CULTURE: NO GROWTH
AEROBIC BOTTLE, BLOOD CULTURE: NO GROWTH
ANAEROBIC BTL, BLOOD CULTURE: NO GROWTH
ANAEROBIC BTL, BLOOD CULTURE: NO GROWTH

## 2014-08-22 MED ORDER — IBUPROFEN 600 MG PO TABS
600.00 mg | ORAL_TABLET | Freq: Four times a day (QID) | ORAL | 0 refills | Status: AC | PRN
Start: 2014-08-22 — End: 2014-08-29

## 2014-08-22 MED ORDER — ONDANSETRON HCL 4 MG/2ML IJ SOLN
4.0000 mg | Freq: Once | INTRAMUSCULAR | Status: AC | PRN
Start: 2014-08-22 — End: 2014-08-22

## 2014-08-22 MED ORDER — LACTATED RINGERS IV SOLN
INTRAVENOUS | Status: AC
Start: 2014-08-22 — End: 2014-08-23
  Administered 2014-08-22: 13:00:00 via INTRAVENOUS

## 2014-08-22 MED ORDER — FENTANYL CITRATE 0.05 MG/ML IJ SOLN
INTRAMUSCULAR | Status: AC
Start: 2014-08-22 — End: 2014-08-22
  Filled 2014-08-22: qty 2

## 2014-08-22 MED ORDER — SILVER NITRATE-POT NITRATE 75-25 % EX MISC
CUTANEOUS | Status: AC
Start: 2014-08-22 — End: 2014-08-23
  Filled 2014-08-22: qty 2

## 2014-08-22 MED ORDER — BUPIVACAINE HCL (PF) 0.25 % IJ SOLN
INTRAMUSCULAR | Status: AC
Start: 2014-08-22 — End: 2014-08-22
  Administered 2014-08-22: 10 mL
  Filled 2014-08-22: qty 30

## 2014-08-22 MED ORDER — FENTANYL CITRATE 0.05 MG/ML IJ SOLN
25.0000 ug | INTRAMUSCULAR | Status: DC | PRN
Start: 2014-08-22 — End: 2014-08-25
  Administered 2014-08-22 (×2): 25 ug via INTRAVENOUS
  Filled 2014-08-22: qty 2

## 2014-08-22 MED ORDER — DOXYCYCLINE HYCLATE 100 MG PO TABS
100.00 mg | ORAL_TABLET | Freq: Every day | ORAL | 0 refills | Status: AC
Start: 2014-08-22 — End: 2014-08-23

## 2014-08-22 MED ORDER — ONDANSETRON HCL 4 MG/2ML IJ SOLN
INTRAMUSCULAR | Status: AC
Start: 2014-08-22 — End: 2014-08-22
  Filled 2014-08-22: qty 2

## 2014-08-22 MED ORDER — SILVER NITRATE-POT NITRATE 75-25 % EX MISC
CUTANEOUS | Status: AC
Start: 2014-08-22 — End: 2014-08-23
  Filled 2014-08-22: qty 1

## 2014-08-22 MED ORDER — MEPERIDINE HCL 50 MG/ML IJ SOLN
12.5000 mg | INTRAMUSCULAR | Status: AC | PRN
Start: 2014-08-22 — End: 2014-08-22
  Administered 2014-08-22 (×4): 12.5 mg via INTRAVENOUS
  Filled 2014-08-22: qty 1

## 2014-08-22 MED ORDER — DOXYCYCLINE MONOHYDRATE 100 MG PO TABS
100.00 mg | ORAL_TABLET | Freq: Once | ORAL | Status: AC
Start: 2014-08-22 — End: 2014-08-22
  Administered 2014-08-22: 100 mg via ORAL
  Filled 2014-08-22: qty 1

## 2014-08-22 MED ORDER — KETOROLAC TROMETHAMINE 30 MG/ML INJ
30.00 mg | Freq: Once | Status: AC
Start: 2014-08-22 — End: 2014-08-22
  Administered 2014-08-22: 30 mg via INTRAVENOUS
  Filled 2014-08-22: qty 1

## 2014-08-22 MED ORDER — MIDAZOLAM HCL 2 MG/2ML IJ SOLN
INTRAMUSCULAR | Status: AC
Start: 2014-08-22 — End: 2014-08-22
  Filled 2014-08-22: qty 2

## 2014-08-22 MED ORDER — PROPOFOL 200 MG/20ML IV EMUL
INTRAVENOUS | Status: AC
Start: 2014-08-22 — End: 2014-08-22
  Filled 2014-08-22: qty 60

## 2014-08-22 NOTE — Progress Notes (Addendum)
Patient to pacu, rigors present, medicated with Demerol as ordered. C/o pain in pelvic area. Hot packs and demerol given. Resting on stretcher in no apparent distress.  Patient resting on stretcher in no apparent distress, however when questioned, states the pain is/10

## 2014-08-22 NOTE — Op Note (Signed)
Date of Operation: 08/22/2014    PREOPERATIVE DIAGNOSIS:  Missed abortion.    POSTOPERATIVE DIAGNOSES:  Same.    PROCEDURE:  Suction dilation and curettage.    SURGEON:  Dr. Ofilia Braun.    ANESTHESIA:  MAC and paracervical block.    FINDINGS:  Exam under anesthesia:  Uterus small, anteverted, mobile, and no adnexal mass bilaterally. Intraoperative:  Moderate amount of products of conception returned on suction curettage.    ESTIMATED BLOOD LOSS:  25 mL.    INTRAVENOUS FLUIDS:  800 mL crystalloid.    URINE OUTPUT:  100 mL clear by straight catheterization prior to procedure.    SPECIMENS TO PATHOLOGY:  Products of conception.    COMPLICATIONS:  None apparent.    ANTIBIOTICS:  Doxycycline 100 mg p.o. preoperatively.    INDICATIONS FOR PROCEDURE:  34 year old, gravida 3, para 2-0-1-2 scheduled for suction D and C by her family medicine provider.  The diagnosis of missed abortion was made on a August 02, 2014 ultrasound at which time the crown rump length was 7 weeks 5 days.  The patient had been managed expectantly with multiple subsequent sonograms again confirming no fetal heart tones, most recently on August 16, 2014.  It should be noted also on August 16, 2014, the patient was evaluated in the Emergency Department for fever with a maximum temperature of 99.5 and abdominal cramping.  The patient subsequently followed up with her family medicine provider and was scheduled for the procedure today.  Upon my meeting the patient in the preop holding area, I reviewed her diagnosis and all treatment options:  expectant, medical treatment with misoprostol, and surgical treatment with dilation and curettage, but also reviewed that I would not recommend further expectant management and would not strongly recommend medical management given the length of time that has already elapsed between diagnosis and treatment, as well as her low-grade temperature last week.  The patient verbalized understanding and agreed and wished to  proceed with dilation and curettage as scheduled.  The procedure and risks were reviewed, and a consent was signed.  In addition, a disposition of fetal remains form was signed, as well.  All of the above was performed with the aid of a face-to-face interpreter.    PROCEDURE IN DETAIL:  The patient was taken to the operating room where anesthesia was administered and she was placed in the dorsal lithotomy position with her legs in candycane stirrups.  An exam under anesthesia was then performed with the findings as noted above.  The patient was then prepped and draped in the normal sterile fashion and a surgical timeout was performed.  A straight catheterization was performed for approximately 100 mL of clear yellow urine.  At this point, a speculum was placed into the patient's vagina, and an excellent view of the cervix was achieved.  The anterior cervical lip was grasped with a single-tooth tenaculum and a paracervical block was achieved; a total of 10 mL of 0.25% Marcaine plain was administered, 5 mL at the 2 o'clock and 5 mL at the 10 o'clock positions at the cervicovaginal junction.  At this point, the cervix was sequentially dilated sufficiently to allow passage of the curved size 8 suction curet past the internal cervical os to the uterine fundus.  The suction tubing was attached and the suction machine was activated.  The curet was rotated to clear the uterus of all products of conception.  Several passes were made in this fashion.  After additional tissue was no longer  returned on suction, a gentle sharp curettage was performed until a uterine cry was appreciated in all areas of the intrauterine cavity.  At this point, the suction curet was again gently introduced into the intrauterine cavity, and again the suction machine was activated and the curet was rotated to clear the uterus of all remaining debris.  At this point, excellent hemostasis was observed at the cervical os.  The single-tooth tenaculum was  removed under direct visualization and excellent hemostasis was achieved at the tenaculum sites using a combination of pressure and silver nitrate.  At this point, all instruments were removed from the patient's vagina, and the procedure was terminated.  Counts were correct per the OR staff and the patient was transported in stable condition and awake to the PACU for recovery.    ___________________________  Reviewed and Electronically Signed By: Brenda NeasJESSICA Poppy Mcafee MD  Sig Date: 08/24/2014  Sig Time: 08:18:39  Dictated By: Brenda NeasJESSICA Latoyia Tecson MD  Dict Date: 08/22/2014 Dict Time: 02 49 PM    Dictation Date and Time:08/22/2014 14:49:15  Transcription Date and Time:08/22/2014 15:07:40  eScription Dictation id: [id number] Confirmation # :[id number]

## 2014-08-22 NOTE — Progress Notes (Signed)
Anesthesia Post-Operative Evaluation Note    Patient: Brenda Braun    Procedure: D and C          POST-OPERATIVE EVALUATION    Anesthesia Post Evaluation    Vitals signs in patient's normal range: Yes  Respiratory function stable; airway patent: Yes  Cardiovascular function stable: Yes  Hydration status stable: Yes  Mental status recovered; patient participates in evaluation and/or is at baseline: Yes  Pain control satisfactory: Yes  Nausea and vomiting control satisfactory: Yes      Patient complaining of pins and needles in b/l LE. On examination, completely intact motor and sensory bilateral lower extremity. I suspect this is from lithotomy positioning and I expect it to get better over the next few hours. Patient is scheduled to go home. We will confirm that she can ambulate prior to discharge. She will be instructed to call Gyn service if the tingling does not improve by the morning.    Donata Duffobert Meerab Maselli, MD

## 2014-08-22 NOTE — Discharge Instructions (Signed)
Dilatação e curetagem ou curetagem a vácuo, cuidados posteriores  (Dilation and Curettage or Vacuum Curettage, Care After)  Consulte esta planilha nas próximas semanas. Essas instruções fornecem informações gerais para seus cuidados após o procedimento. Seu médico pode também lhe fornecer instruções detalhadas. Seu tratamento foi planejado de acordo com as práticas médicas atuais, mas às vezes ocorrem problemas. Ligue para seu médico se tiver algum problema ou perguntas após o seu procedimento.  O QUE ESPERAR APÓS O PROCEDIMENTO  Depois do seu procedimento, é normal ter cólicas leves e sangramento. Eles podem durar de 2 dias a 2 semanas após o procedimento.  INSTRUÇÕES PARA TRATAMENTO DOMICILIAR   · Não dirija por 24 horas.  · Aguarde 1 semana antes de retornar a atividades extenuantes.  · Meça sua temperatura 2 vezes ao dia, por 4 dias e anote-a Forneça essas temperaturas para o seu profissional de saúde se você tiver febre.  · Evite ficar longos períodos de pé  · Evite levantar, empurrar ou puxar peso. Não levante nada mais pesado que 10 libras (4,5 kg).  · Limite subidas de escada a uma ou duas vezes ao dia.  · Tenha períodos de descanso frequentes.  · Você pode continuar sua dieta normal.  · Beba líquidos o suficiente para manter sua urina clara ou na cor amarelo pálida.  · Sua função intestinal deve voltar ao normal. Se você tiver constipação, você deve:  · Tomar um laxante suave com a permissão do seu profissional de saúde.  · Adicionar frutas e farelos à sua dieta.  · Beber mais líquidos.  · Tomar banhos de chuveiro, em vez de banheira, até que seu profissional de saúde lhe dê permissão para tomar banhos de banheira.  · Não nade nem use banheira de hidromassagem até que seu profissional de saúde aprove.  · Tente ter um acompanhante ou alguém disponível nas primeiras 24 a 48 horas, especialmente se você tiver sido submetido a anestesia geral.  · Não use ducha higiênica, absorventes higiênicos internos nem  tenha relações sexuais por 2 semanas depois do procedimento.  · Somente tome medicamentos, vendidos com ou sem receita médica, conforme indicado pelo seu profissional de saúde. Não tome aspirina. Ela pode causar hemorragia.  · Faça acompanhamento com o seu profissional de saúde conforme orientado.  PROCURE UM MÉDICO SE:   · Tiver aumento de cólicas ou de dor que não seja aliviado com os medicamentos.  · Tiver dor abdominal que não pareça estar relacionada à mesma área das cólicas e dores prévias.  · Tiver corrimento vaginal com cheiro ruim.  · Tiver erupções.  · Você estiver tendo problemas com algum medicamento.  PROCURE UM MÉDICO IMEDIATAMENTE SE:   · Tiver sangramento mais forte que um período menstrual normal.  · Tiver febre.  · Tiver dores no peito.  · Tiver dificuldade em respirar.  · Sentir tonturas ou como se fosse desmaiar.  · Desmaiar.  · Tiver dor nas áreas das juntas dos ombros.  · Tiver sangramento vaginal intenso com ou sem coágulos de sangue.  Document Released: 11/07/2004 Document Revised: 11/18/2012  ExitCare® Patient Information ©2014 [employer].

## 2014-08-22 NOTE — Brief Op Note (Signed)
Brief Procedure or Operative Note    Procedure: Suction Dilation and Curettage    Pre-Procedure Diagnosis:   Missed Abortion      Post-Procedure Diagnosis:  Missed Abortion    Surgeon: Lysle DingwallVerosko    Assistant:  None    Type of Anesthesia:   MAC = Monitored Anesthesia Care (Sedation) and paracervical block    Findings:   EUA: uterus small, AV, mobile, no adnexal mass bilaterally, Intraop: moderate POC resturned on suction    Estimated Blood Loss: 25 mL  IVF: 800 mL crystalloid  UOP: 100 mL clear by straight cath     Specimens Removed:  Other: POC    Complications:  None apparent    Other (e.g. Implants):  None    Antibiotics: Doxy 100 mg pre-op     Ofilia NeasJessica Zakkiyya Barno, MD, 08/22/2014, 2:39 PM      Pager : [phone]                  .

## 2014-08-22 NOTE — H&P (Signed)
OB On Call Attending  Face to face interpreter    In to meet patient in pre-op area.   Chart reviewed and patient history reviewed as well:   PMH: hypothyroidism, PSH: c/s, chole, lipo, Meds: levothyroxine, MVI/Vit B, Tylenol prn, All: PCN/Latex    Patient with missed Ab diagnosed on 08/02/14 sono (CRL 136w5d at that time), managed expectantly since with multiple subsequent sonos confirming no FHT, most recently on 08/16/14 when the patient was evaluated for fever (tmax noted 99.5) and abdominal cramping. Patient seen by family medicine provider on Wed 7/6 and was [facility].     I reviewed the diagnosis and treatment options: expectant, medical treatment with miso, and surgical treatment with D&C, but reviewed that I would not recommend further expectant management and would not strongly recommend medical management given the length of time that has already lapsed between diagnosis and treatment as well as low grade temp last week. The patient verbalized understanding and agreed, wished to proceed with D&C as scheduled.     D&C procedure and risks reviewed, including but not limited to bleeding, possible transfusion plus associated risks, infection, uterine perforation with or without damage to surrounding structures (bowel/bladder/fallopian tubes/ovaries/ureters/blood vessels/nerves) as well as the possibility of further surgery (laparoscopy at minimum and possible exploratory-laparotomy) to correct such damage should it occur. Patient verbalized understanding and a consent was signed. Also, disposition of remains form was signed.     Doxy 100 mg po x 1 given in pre-op.  Post-op instructions/precautions reviewed with patient via interpreter in pre-op as well.   Will Rx Doxy 100 mg po x 1 twelve hours after the first dose and Ibu 600 mg po q hours prn pain.  Follow up 2 weeks for post op check or prn.

## 2014-08-22 NOTE — Progress Notes (Signed)
Interpreter into go through instructions. Toradol ok'd by dr Lysle Dingwallverosko to give for cramps which was given. Pt states her legs feel like "pins and needles" and it has felt like that since she came out of the OR. Anesthesia and Dr Lysle DingwallVerosko informed. Dr Artist Beachanelli into assess and states pt ok to d/c and Resident Dana AllanEmily Wilkans into assess and states pt ok to d/c. Pt stood with caution and states pins and needles mostly gone at d/c. Toradol helped with cramps and told next dose of ibuprofen can be taken at 1030pm. Scripts filled and given. Pt left unit via w.c.

## 2014-08-22 NOTE — H&P (Signed)
Pre-Anesthetic Note    Pre op Diagnosis:     Planned Procedure:     Patient ID:  Brenda Braun isa 34 year old female  Height: 5' 4.5" (163.8 cm)  Weight: 80.7 kg (178 lb)    Previous Anesthetic History:     Past Surgical History    LAPAROSCOPY SURG CHOLECYSTECTOMY  2007    LIPOSUCTION      OB ANTEPARTUM CARE CESAREAN DLVR & POSTPARTUM      CHOLECYSTECTOMY       Patient denies complicationsof anesthesia.  Patient denies family complications of anesthesia.    Current Medications:      Current Outpatient Prescriptions:  cetirizine (ZYRTEC) 5 MG tablet Take 1 tablet by mouth daily Disp: 30 tablet Rfl: 11   acyclovir (ZOVIRAX) 5 % ointment Apply  topically every 3 (three) hours. Disp: 30 g Rfl: 3   cholecalciferol (VITAMIN D3) 2000 UNIT tablet Take 1 tablet by mouth daily. To pharm: pls d/c all other Vit D Rx Disp: 90 tablet Rfl: 3   Prenatal Vit-Fe Fumarate-FA (PRENATAL VITAMINS) 28-0.8 MG TABS Take 1 tablet by mouth daily Disp: 90 tablet Rfl: 3   levothyroxine (LEVOTHROID) 112 MCG tablet Take 1 tablet by mouth daily Disp: 90 tablet Rfl: 0     Current Facility-Administered Medications:  doxycycline (ADOXA) tablet 100 mg 100 mg Oral Once [facility]       Hospital Meds   doxycycline  100 mg Oral Once     PRN Meds  Current Facility-Administered Medications   Medication Dose Route Frequency Last Dose           Allergies:   Review of Patient's Allergies indicates:   Penicillins             Hives    Smoking, Alcohol, Drugs:     Smoking status: Never Smoker    Smokeless tobacco: Never Used    Alcohol use No       Drug use: No       PMHx:    Past Medical History    Pyelonephritis        PHYSICAL EXAMINATION:  BP 107/70  Pulse 72  Temp 97.4 F (36.3 C) (Temporal)  Resp 20  Ht 5' 4.5" (1.638 m)  Wt 80.7 kg (178 lb)  LMP 05/29/2014  SpO2 100%  BMI 30.08 kg/m2    General:     Airway Classification:  Mallampati: Class II   The same as Class I except the tonsilar pillars are hidden by the tounge.   Mallampati Airway  Classification:   Dental Exam: wnl  Oral Opening:  2  Full range of neck Motion:  Yes     Lungs: clear to auscultation bilaterally    Heart: normal and regular rate and rhythm    EKG/Echo:     Chest X-Ray:     Pertinent Labs:     Lab Results  Component Value Date   NA 139 08/16/2014   K 3.8 08/16/2014   CREAT 0.8 08/16/2014   GLUCOSER 94 08/16/2014   WBC 6.6 08/16/2014   HCT 39.6 08/16/2014   PLTA 215 08/16/2014   PT 11.5 12/29/2013   INR 1.1 12/29/2013        Other Findings: None    ASA Assessment: I A Normal Healthy Patient  Emergency:  No     Plan:      Georgiann CockerJianxun Deriyah Kunath, MD   Pager:           Monica MartinezBold Font =  Mandatory Field.  For non-mandatory fields, the provider will enter relevant positive findings.

## 2014-08-23 NOTE — Progress Notes (Signed)
DOS: 08/22/14  Procedure: D+C  Dr Lysle DingwallVerosko    Phone: [phone]  With [place] interpreter    Was pt reached?   yes  Are you having any pain?   8/10  If so, are you taking any pain medication?   Just took ibuprofen.  Has been taking it every 6 hours with relief  Is the pain medication helping?   yes  Are you using any other pain management techniques?   rest     Are you having any problems with nausea or vomiting?   no  Are you tolerating food and liquids?   yes  Are you having any trouble urinating?   no  Do you have any fever?   no      Have you noted any bleeding on your bandage?   scant                         If so, describe:     Do you have any questions or concerns at this time?  None.  Advised to contact office if pain relief below 5 cannot be achieved - agrees to plan.  Phone # provided    [facility]

## 2014-08-25 ENCOUNTER — Telehealth (HOSPITAL_BASED_OUTPATIENT_CLINIC_OR_DEPARTMENT_OTHER): Payer: Self-pay | Admitting: "Women's Health Care

## 2014-08-25 NOTE — Progress Notes (Signed)
S/P D & C yesterday   Path pending  Having pelvic cramping off and on.   Feels like her belly is swollen.  Brown vaginal d/c yesterday , now red.Denies fever or chills.  Eating and drinking  Voiding without discomfort  Had a BM today.  Walking around the house.  Condolences to patient  States she really wanted this pregnancy  F/u post op 09/13/14  Her husband is on his way home  Instructed patient to call me back with her temperature.  Is going to lie down with a heating pad  Await call back

## 2014-08-25 NOTE — Telephone Encounter (Signed)
-----   Message from [facility] sent at 08/25/2014  1:16 PM EDT -----  Regarding: pain after D&C.  Suzi RootsDaniele Clyatt [phone], 34 year old, female, Telephone Information:  Home Phone      [phone]  Work Phone      [phone]  Mobile          [phone]      CALL BACK NUMBER: home  Cell phone:   Other phone:    Available times:    Patient's language of care: [place]Portuguese [place]ian    Patient needs a [place] interpreter.    Patient's PCP: Willis ModenaBRAHVER,DANIT MD    Person calling on behalf of patient: Patient (self)    Calls today pain after D&C.

## 2014-08-26 LAB — SURGICAL PATH SPECIMEN

## 2014-08-26 NOTE — Progress Notes (Signed)
Returning call from Dr Jeralyn BennettVeroskos postop patient.  S/P D&C 08/22/14 for Missed AB.    Via [place]Portuguese interpreter:  Day 4 postop:  Having difficulty with constipation.    She tells me that she did move her bowels today but it was difficult.  She is not taking narcotics.  She is not a big water drinker and hasn't been drinking.   I've asked her to drink more water to prevent constipation.  Ambulate more and try to resume normal routine activity.

## 2014-08-26 NOTE — Progress Notes (Signed)
mlom requesting a call back to speak with a nurse

## 2014-08-26 NOTE — Telephone Encounter (Signed)
-----   Message from [facility] sent at 08/26/2014  4:00 PM EDT -----  Regarding: post op pain  Brenda Braun [id number] [phone], 34 year old, female, Telephone Information:  Home Phone      [phone]  Work Phone      [phone]  Mobile          [phone]      CALL BACK NUMBER: home  Cell phone:   Other phone:    Available times:    Patient's language of care: [place]Portuguese [place]ian    Patient needs a [place] interpreter.    Patient's PCP: Willis ModenaBRAHVER,DANIT MD    Person calling on behalf of patient: Patient (self)    Calls today : post op pain

## 2014-09-13 ENCOUNTER — Ambulatory Visit (HOSPITAL_BASED_OUTPATIENT_CLINIC_OR_DEPARTMENT_OTHER): Payer: Commercial Managed Care - PPO | Admitting: Obstetrics & Gynecology

## 2014-09-14 ENCOUNTER — Ambulatory Visit (HOSPITAL_BASED_OUTPATIENT_CLINIC_OR_DEPARTMENT_OTHER): Payer: Commercial Managed Care - PPO | Admitting: Advanced Practice Midwife

## 2014-09-14 ENCOUNTER — Encounter (HOSPITAL_BASED_OUTPATIENT_CLINIC_OR_DEPARTMENT_OTHER): Payer: Self-pay | Admitting: Advanced Practice Midwife

## 2014-09-14 VITALS — BP 107/69 | HR 57 | Wt 177.5 lb

## 2014-09-14 DIAGNOSIS — Z9889 Other specified postprocedural states: Principal | ICD-10-CM

## 2014-09-14 LAB — HCG QUANTITATIVE: HCG QUANTITATIVE: 6 m[IU]/mL — ABNORMAL HIGH (ref 0–4)

## 2014-09-14 NOTE — Progress Notes (Signed)
[place] phone interpretor throughout visit. Here today for f/u after D&E on 08/22/14. No c/o. Bleeding has stopped, no fevers. Had incident of "slight discomfort" with urination 5 days ago. Plan urine culture today. Constipation has resolved. Pelvic exam: WNL, no CMT, non tender to palpation. CE: L/T/C. Birth control options discussed. Wants conception. Declines birth control. Teaching re: await normal menses before attempting contraception. Pt asks for U/S to "be sure that the pregnancy is gone." [facility] & teaching done.  I have spent 15 minutes in face to face time with this patient/patient proxy of which > 50% was in counseling or coordination of care regarding above issues/Dx.  RTC 1 yr for annual or prn. Dereck Ligas, CNM

## 2014-09-15 LAB — URINE CULTURE

## 2014-09-30 ENCOUNTER — Ambulatory Visit (HOSPITAL_BASED_OUTPATIENT_CLINIC_OR_DEPARTMENT_OTHER): Payer: Commercial Managed Care - PPO | Admitting: Family Medicine

## 2014-09-30 ENCOUNTER — Encounter (HOSPITAL_BASED_OUTPATIENT_CLINIC_OR_DEPARTMENT_OTHER): Payer: Self-pay | Admitting: Family Medicine

## 2014-09-30 VITALS — BP 110/70 | HR 55 | Temp 97.2°F | Wt 177.0 lb

## 2014-09-30 DIAGNOSIS — I8390 Asymptomatic varicose veins of unspecified lower extremity: Secondary | ICD-10-CM

## 2014-09-30 DIAGNOSIS — Z9889 Other specified postprocedural states: Secondary | ICD-10-CM

## 2014-09-30 DIAGNOSIS — B338 Other specified viral diseases: Secondary | ICD-10-CM

## 2014-09-30 DIAGNOSIS — B349 Viral infection, unspecified: Principal | ICD-10-CM

## 2014-09-30 DIAGNOSIS — I8392 Asymptomatic varicose veins of left lower extremity: Secondary | ICD-10-CM

## 2014-09-30 DIAGNOSIS — Z9882 Breast implant status: Secondary | ICD-10-CM

## 2014-09-30 LAB — URINE PREGNANCY TEST (POINT OF CARE): HCG QUALITATIVE URINE: NEGATIVE

## 2014-09-30 NOTE — Patient Instructions (Signed)
Eat small frequent meals/snacks throughout the day  Drink a lot water  Take TYLENOL 500 mg-1000 mg every 6 hours as needed for headache pain   STOP the ibuprofen & naproxen    Comer pequenas refeies frequentes / lanches durante todo o dia  Beba muita gua  Tomar Tylenol 500 mg-1000 mg a cada 6 horas, conforme necessrio para a dor de cabea  PARAR o ibuprofeno e naproxeno

## 2014-09-30 NOTE — Progress Notes (Signed)
SUBJECTIVE:  Brenda Braun is a 34 year old female who presents for an acute visit.     With the following concern(s): headache, nausea, dizziness x 24 hours    34 yo overweight [place] F with allergic rhinitis (on zyrtec), hypothyroidism (on levo 112 mcg, currently trying to conceive), anxiety, recent SAB (s/p D&C 08/22/14) here for evaluation of headache x 24 hours.    #) S/p D&C  She has been feeling well since Brenda Braun 08/22/14. Had 2 days of vaginal bleeding 8/10-8/11/16 one week ago, otherwise no vaginal bleeding. Otherwise denies fever, pelvic pain, malodorous vaginal discharge. Wants to try to get pregnant again in 2 months. Has been using withdrawal and rhythm method to prevent pregnancy.     Relieved that Brenda Braun is neg today.    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 Disp: 30 g Rfl: 3   cholecalciferol (VITAMIN  D3) 2000 UNIT tablet Take 1 tablet by mouth daily. To pharm: pls d/c all other Vit D Rx Disp: 90 tablet Rfl: 3     No current facility-administered medications for this visit.     ALLERGIES:  Review of Patient's Allergies indicates:   Penicillins             Hives   Latex                   Rash, Other (See Comments)    Comment:blisters    VITALS:  BP 110/70  Pulse 55  Temp 97.2 F (36.2 C) (Temporal)  Wt 80.3 kg (177 lb)  LMP 05/29/2014  SpO2 100%  BMI 29.91 kg/m2  Pain Score: 9 (9/10)  Afebrile, normotensive.  GEN: tired-appearing F, no acute resp distress  HEENT: NCAT. PERRLA, EOMI. Lips slightly cracked and dry. Neck soft, FROM in neck.  CARDIAC: HR 60, regular.  PULM: lungs are CTA b/l  ABD: soft NTND abdomen, no rigidity, rebound, mass.  PSYCH: appropriate behavior, cooperative with exam. Mood is good, full congruent affect. Appropriate thought content. Organized thought process. Good insight and judgement      TSH (THYROID STIM HORMONE) (uIU/mL)   Date Value   07/12/2014 5.440 (H)  04/18/2014 2.980   03/14/2014 6.900 (H)   ----------    A/P:  Brenda Braun is a 34 year old female who presents with:    (B34.9) Viral illness  (primary encounter diagnosis)  Comment: benign abd exam, hemodynamically stable, uHCG neg  Plan: URINE PREGNANCY TEST (POINT OF CARE)  - supportive tx with rest (work letter written excusing her from work until 8/24), adequate hydration, tylenol [phone] mg every 8 hours prn  - STOP NSAID meds d/t gastritis    (Z98.89) Status post dilation and curettage  Comment: doing well 2 months s/p D&C under sedation for SAB which was a highly desired pregnancy. Currently using rhythm and withdrawal for birth control, not ready to conceive for another 2 months.  Plan:   - discussed/declined hormonal BC method  - counseled abt adding condoms + spermicide in addn to rhythm/withdrawal to improve efficacy of birth control  - continue 112 mcg levothyroxine, prenatal vitamin  - f/u prn    (I83.92)  Asymptomatic varicose veins, left  Plan: REFERRAL TO VASCULAR SURGERY ( INT)    (Z98.82) S/P breast augmentation  Plan: REFERRAL TO PLASTIC SURGERY (EXT): [facility]    Dispostion:  prn    *AVS printed    I have spent 40 minutes in face to face time with this patient/patient proxy of which > 50% was in counseling or coordination of care regarding above issues/Dx.

## 2014-10-06 ENCOUNTER — Encounter (HOSPITAL_BASED_OUTPATIENT_CLINIC_OR_DEPARTMENT_OTHER): Payer: Self-pay | Admitting: Vascular Surgery

## 2014-10-06 ENCOUNTER — Ambulatory Visit (HOSPITAL_BASED_OUTPATIENT_CLINIC_OR_DEPARTMENT_OTHER): Payer: Commercial Managed Care - PPO | Admitting: Vascular Surgery

## 2014-10-06 ENCOUNTER — Telehealth (HOSPITAL_BASED_OUTPATIENT_CLINIC_OR_DEPARTMENT_OTHER): Payer: Self-pay | Admitting: Infectious Diseases

## 2014-10-06 ENCOUNTER — Encounter (HOSPITAL_BASED_OUTPATIENT_CLINIC_OR_DEPARTMENT_OTHER): Payer: Self-pay | Admitting: Family Medicine

## 2014-10-06 VITALS — BP 104/60 | HR 67 | Temp 98.1°F | Wt 177.5 lb

## 2014-10-06 DIAGNOSIS — I8390 Asymptomatic varicose veins of unspecified lower extremity: Secondary | ICD-10-CM

## 2014-10-06 DIAGNOSIS — I8393 Asymptomatic varicose veins of bilateral lower extremities: Principal | ICD-10-CM

## 2014-10-06 NOTE — Telephone Encounter (Signed)
-----   Message from [facility] sent at 10/06/2014 10:50 AM EDT -----  Regarding: Surgery   Contact: [phone]  Brenda Braun [id number], 34 year old, female    Calls today:  Clinical Questions (NON-SICK CLINICAL QUESTIONS ONLY)    Name of person calling DAnielle  Specific nature of request  Patient would like Dr.Bravher to give her  call in regards to surgery appt   Return phone number [phone]  Person calling on behalf of patient: Patient (self)        Patient's language of care: [place]    Patient needs a [place] interpreter.    Patient's PCP: Willis Modena MD

## 2014-10-06 NOTE — Progress Notes (Signed)
Call made to pt with interpreter, advised PCP not here this week.    Pt wondering when her breast augmentation at [facility] Appt is, advised external referral takes some time, will check with referral coordinator. There is an appt booked with surgery in October. Please clarify what that appt is for.     Advised her recent lab test normal, negative for pregnancy. Pt denies dysuria.     Pt sad to see PCP leave, may call [facility] to follow her.

## 2014-10-06 NOTE — Progress Notes (Signed)
CC: spider veins    HPI: 34 yo F s/p recent D&C for miscarriage.  C/o bilateral leg spider veins that she has had since first pregnancy.  No worse with most recent pregnancy.  Denies pain overlying the veins.  Denies ankle edema.  Denies h/o DVT.    PMH/PSH/Meds/All/FH/SH reviewed in Epic    ROS: Denies headaches, recent vision or hearing changes, rashes, CP, SOB, N/V, F/C, weight loss, abnormal bleeding    EXAM:   10/06/14  1019   BP: 104/60   Pulse: 67   Temp: 98.1 F (36.7 C)   Weight: 80.5 kg (177 lb 7.5 oz)     Alert and oriented x 3  Appropriate affect  Unlabored respirations  Palpable bilateral DP pulses  Scattered spider and subtle 3mm reticular veins over both thighs and upper calves  No ankle edema  No skin texture or color changes    A/P: 34 year old  with spider veins.  We discussed sclerotherapy.  I would recommend she wait until she is done having children so that she can most cost-effectively treat all spider veins at once.

## 2014-10-11 ENCOUNTER — Other Ambulatory Visit (HOSPITAL_BASED_OUTPATIENT_CLINIC_OR_DEPARTMENT_OTHER): Payer: Self-pay | Admitting: Family Medicine

## 2014-10-11 DIAGNOSIS — Z9882 Breast implant status: Principal | ICD-10-CM

## 2014-10-12 ENCOUNTER — Telehealth (HOSPITAL_BASED_OUTPATIENT_CLINIC_OR_DEPARTMENT_OTHER): Payer: Self-pay | Admitting: Infectious Diseases

## 2014-10-12 NOTE — Telephone Encounter (Signed)
-----   Message from Derrick Ravel sent at 10/12/2014  1:54 PM EDT -----  Regarding: UTI Symptoms  Contact: [phone]  Merilee Wible [id number], 34 year old, female    Calls today:  Sick    What are the symptoms UTI symptoms  How long has patient been sick? 4 days  What has pt. tried at home: tylenol   Person calling on behalf of patient: Patient (self)    CALL BACK NUMBER: [phone]  Best time to call back: anytime    Patient's language of care: [place]    Patient needs a [place] interpreter.    Patient's PCP: Willis Modena MD

## 2014-10-12 NOTE — Progress Notes (Signed)
Using interpreter, c/o sore and itchiness around vaginal area. States she placed  otc Miconazole cream vaginally and topically on the side with some relief. Wondering if should continue but pt was not really answering my questions. Asked how many times have she used it and she states it is to be used prn, used in the past. States she used it yesterday. Then states there is a strong smell and has abd cramping. Asked if there are urinary symptoms like burning, but she did not answer sounds like vaginal issue. Recommend eval in clinic.     Informed pt of referral that her  plastic surgery is booked 11/2014. Pt states ok.

## 2014-10-13 ENCOUNTER — Encounter (HOSPITAL_BASED_OUTPATIENT_CLINIC_OR_DEPARTMENT_OTHER): Payer: Self-pay | Admitting: Family Medicine

## 2014-10-13 ENCOUNTER — Ambulatory Visit (HOSPITAL_BASED_OUTPATIENT_CLINIC_OR_DEPARTMENT_OTHER): Payer: Commercial Managed Care - PPO | Admitting: Family Medicine

## 2014-10-13 VITALS — BP 100/68 | HR 60 | Temp 98.0°F | Wt 178.0 lb

## 2014-10-13 DIAGNOSIS — R300 Dysuria: Secondary | ICD-10-CM

## 2014-10-13 DIAGNOSIS — N760 Acute vaginitis: Principal | ICD-10-CM

## 2014-10-13 LAB — URINALYSIS
BACTERIA: >50 PER HPF — AB (ref 0–5)
BILIRUBIN, URINE: NEGATIVE
CASTS: NONE SEEN PER LPF
CRYSTALS: NONE SEEN
GLUCOSE, URINE: NEGATIVE MG/DL
KETONE, URINE: NEGATIVE MG/DL
NITRITE, URINE: NEGATIVE
PH URINE: 6 (ref 5.0–8.0)
PROTEIN, URINE: NEGATIVE MG/DL
SPECIFIC GRAVITY URINE: 1.015 (ref 1.003–1.035)
SQUAMOUS EPITHELIAL CELLS: >10 PER LPF — AB (ref 0–4)

## 2014-10-13 LAB — POC URINALYSIS
BILIRUBIN, URINE: NEGATIVE
GLUCOSE,URINE: NEGATIVE
KETONE, URINE: NEGATIVE
NITRITE, URINE: NEGATIVE
PH URINE: 6.5 (ref 5.0–8.0)
PROTEIN, URINE: NEGATIVE
SPECIFIC GRAVITY, URINE: 1.02 (ref 1.003–1.030)
UROBILINOGEN URINE: 0.2 (ref 0.2–1.0)

## 2014-10-13 LAB — VAGINITIS PANEL (DNA PROBE)
CANDIDA SPECIES: NEGATIVE
GARDNERELLA VAGINALIS: NEGATIVE
TRICHOMONAS VAGINALIS: NEGATIVE

## 2014-10-13 MED ORDER — FLUCONAZOLE 150 MG PO TABS
150.00 mg | ORAL_TABLET | Freq: Once | ORAL | 0 refills | Status: AC
Start: 2014-10-13 — End: 2014-10-13

## 2014-10-13 NOTE — Progress Notes (Signed)
[facility]    Brenda Braun is a 34 year old female who presents for:    CC:  Vaginitis    HPI:  4 days of dysuria/burning, and vaginal pruritis--"itching" (lateral to vagina bilaterally, and inside vagina).  Urine is very yellow.  Bought cream at pharmacy ("Miconazole 1" vaginal insert and cream) which brought slight symptom improvement.    Denies fever or changes in her vaginal discharge.    Current Outpatient Prescriptions:  cetirizine (ZYRTEC) 5 MG tablet Take 1 tablet by mouth daily Disp: 30 tablet Rfl: 11   Prenatal Vit-Fe Fumarate-FA (PRENATAL VITAMINS) 28-0.8 MG TABS Take 1 tablet by mouth daily Disp: 90 tablet Rfl: 3   levothyroxine (LEVOTHROID) 112 MCG tablet Take 1 tablet by mouth daily Disp: 90 tablet Rfl: 0   acyclovir (ZOVIRAX) 5 % ointment Apply  topically every 3 (three) hours. Disp: 30 g Rfl: 3   cholecalciferol (VITAMIN D3) 2000 UNIT tablet Take 1 tablet by mouth daily. To pharm: pls d/c all other Vit D Rx Disp: 90 tablet Rfl: 3     Review of Patient's Allergies indicates:   Penicillins             Hives   Latex                   Rash, Other (See Comments)    Comment:blisters    Physical Exam:  BP 100/68  Pulse 60  Temp 98 F (36.7 C) (Temporal)  Wt 80.7 kg (178 lb)  LMP 09/19/2014 (Approximate)  SpO2 98%  Breastfeeding? No  BMI 30.08 kg/m2    General: Alert, awake, no acute distress. Well nourished, well-dressed, overweight female.  Psyc:  Mood is good, affect is full  Head:  Normocephalic, atraumatic.    Eyes: PERRL.   Cardiac: RRR.  S1, S2 present. No S3, S4, or murmurs.  Lungs:  CTA, lung sounds equal bilaterally. No wheezes, crackles, or rhonchi.  Neuro: Grossly intact.  PELVIC:  External Genitalia: normal architecture, no lesions, no discharge and shaved escutcheon  Vagina: well rugated, well estrogenized and no lesions  Vaginal Discharge: scanty, white, curd-like, clumpy and non-odorous  Pelvic supports: normal  Cervix: no lesions and no cervical motion  tenderness  Uterus: non-tender  Adnexa: not examined  Vaginal pH:  5.5     Assessment and Plan:  Brenda Braun is a 34 year old female with:    1. Acute vaginitis- pruritis and dysuria  Most likely differentials are yeast vaginitis, BV (bacterial vaginosis), or UTI.  The elevated vaginal pH is consistent with BV, however she lacks an abnormal vaginal odor.  UTI is possible considering the occult blood and leuks, however this could be secondary to a yeast or BV infection.  The characteristic of her discharge is most consistent with yeast, which is also supported by her symptom partial improvement with use of OTC miconazole.  - Urinalysis  - Vaginitis Panel (DNA Probe)  - Urine Dipstick (Point of Care)  -Urinalysis  -Fluconazole (Diflucan) 150 mg PO once    Will empirically treat for most likely diagnosis, vaginal candidiasis, with one dose of diflucan.      If vaginitis panel returns confirming yeast, I will prescribe a second dose for therapy completion.  If urinalysis returns with additional information suggesting UTI, I will prescribe nitrofurantoin.  If vaginitis panel returns indicating BV, I will treat that accordingly.    -Advised patient that I will call with test results and advice her regarding  next steps in treatment.    Next visit:  As needed    Discussed with Dr. Dory Peru, MD

## 2014-10-13 NOTE — Progress Notes (Signed)
Preceptor Note  I personally reviewed this case, along with the patient's history and exam findings, with Dr. Donnell. I confirm the findings, and agree with the assessment and plan, as documented in the visit note.    Alinna Siple K. Linnea Todisco, MD

## 2014-10-14 ENCOUNTER — Telehealth (HOSPITAL_BASED_OUTPATIENT_CLINIC_OR_DEPARTMENT_OTHER): Payer: Self-pay | Admitting: Family Medicine

## 2014-10-14 MED ORDER — NITROFURANTOIN MONOHYD MACRO 100 MG PO CAPS
100.00 mg | ORAL_CAPSULE | Freq: Two times a day (BID) | ORAL | 0 refills | Status: AC
Start: 2014-10-14 — End: 2014-10-19

## 2014-10-14 NOTE — Progress Notes (Signed)
Called to notify patient of recent testing results from yesterday's appointment.  Vaginitis panel was negative.  Urinalysis is consistent with likely UTI.  Will treat with Macrobid x 5 days.  Prescription called in to her pharmacy.    Katherine Basset, MD, 10/14/2014, 8:38 PM

## 2014-10-23 ENCOUNTER — Emergency Department (HOSPITAL_BASED_OUTPATIENT_CLINIC_OR_DEPARTMENT_OTHER)
Admission: RE | Admit: 2014-10-23 | Disposition: A | Discharge status: Home / Self Care | Payer: Self-pay | Source: Emergency Department | Attending: Emergency Medicine | Admitting: Emergency Medicine

## 2014-10-23 MED ORDER — EPINEPHRINE 0.3 MG/0.3ML AUTO-INJECTOR
0.3000 mg | Freq: Once | INTRAMUSCULAR | 0 refills | Status: AC | PRN
Start: 2014-10-23 — End: 2014-10-23

## 2014-10-23 MED ORDER — DIPHENHYDRAMINE HCL 50 MG PO CAPS
50.00 mg | ORAL_CAPSULE | Freq: Four times a day (QID) | ORAL | 0 refills | Status: AC | PRN
Start: 2014-10-23 — End: 2014-10-27

## 2014-10-23 MED ORDER — FAMOTIDINE 20 MG PO TABS
20.00 mg | ORAL_TABLET | Freq: Once | ORAL | Status: AC
Start: 2014-10-23 — End: 2014-10-23
  Administered 2014-10-23: 20 mg via ORAL
  Filled 2014-10-23: qty 1

## 2014-10-23 MED ORDER — PREDNISONE 20 MG PO TABS
40.00 mg | ORAL_TABLET | Freq: Once | ORAL | Status: AC
Start: 2014-10-23 — End: 2014-10-23
  Administered 2014-10-23: 40 mg via ORAL
  Filled 2014-10-23: qty 2

## 2014-10-23 MED ORDER — DIPHENHYDRAMINE HCL 50 MG PO CAPS
50.00 mg | ORAL_CAPSULE | Freq: Once | ORAL | Status: AC
Start: 2014-10-23 — End: 2014-10-23
  Administered 2014-10-23: 50 mg via ORAL
  Filled 2014-10-23: qty 1

## 2014-10-23 MED ORDER — FAMOTIDINE 20 MG PO TABS
20.00 mg | ORAL_TABLET | Freq: Two times a day (BID) | ORAL | 0 refills | Status: AC
Start: 2014-10-23 — End: 2014-10-27

## 2014-10-23 MED ORDER — PREDNISONE 20 MG PO TABS
40.00 mg | ORAL_TABLET | Freq: Every day | ORAL | 0 refills | Status: AC
Start: 2014-10-23 — End: 2014-10-27

## 2014-10-23 NOTE — ED Triage Note (Signed)
Presents ambulatory to the ED with c/o red rash to the body since trying out sample food at [place] today. Reports similar reaction 2 weeks ago with hives to the body. Patient reports taking Loratadine and hydrocortisone creme with some relief. C/o itching in her throat going on intermittently for last few days, not new since the new exposure today. No respiratory distress. Speaking in full sentences.

## 2014-10-23 NOTE — Narrator Note (Signed)
Patient Disposition    Patient education for diagnosis, medications, activity, diet and follow-up.  Patient left ED 11:27 PM.  Patient rep received written instructions.  Interpreter to provide instructions: Yes    Patient belongings with patient: YES    Have all existing LDAs been addressed? N/A    Have all IV infusions been stopped? N/A    Discharged to: Discharged to home.

## 2014-10-23 NOTE — Narrator Note (Signed)
Patient reports resolution of symptoms. Requesting discharge with prescriptions.

## 2014-11-03 NOTE — ED Provider Notes (Signed)
The patient was seen primarily by me. ED nursing record was reviewed. Prior records as available electronically through the Epic record were reviewed.    History, physical exam and disposition were conducted with an official hospital [place] interpreter.    HPI:    This 34 year old female patient with a history of hypothyroidism presents with pruritic rash to her upper anterior chest and throat and some subjective throat itching that began this evening after she sampled various foods at [facility].  No previous history of significant allergic reactions.  No wheezing, shortness of breath, throat closing, or voice change.  No dizziness, nausea, vomiting, or abdominal pain.  Took Benadryl earlier with some improvement in the rash - consistent with urticaria when it started.  More to keep track of when he is more like  ROS: Pertinent positives were reviewed as per the HPI above. All other systems were reviewed and are negative.      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   Current Outpatient Prescriptions on File Prior to Encounter:  cetirizine (ZYRTEC) 5 MG tablet Take 1 tablet by mouth daily Disp: 30 tablet Rfl: 11   Prenatal Vit-Fe Fumarate-FA (PRENATAL VITAMINS) 28-0.8 MG TABS Take 1 tablet by mouth daily Disp: 90 tablet Rfl: 3   acyclovir (ZOVIRAX) 5 % ointment Apply  topically every 3 (three) hours. Disp: 30 g Rfl: 3   cholecalciferol (VITAMIN D3) 2000 UNIT tablet Take 1 tablet by mouth daily. To  pharm: pls d/c all other Vit D Rx Disp: 90 tablet Rfl: 3         Social History:   Social History   Marital status: Married  Spouse name: N/A    Years of education: N/A  Number of children: N/A     Occupational History  None on file     Social History Main Topics   Smoking status: Never Smoker    Smokeless tobacco: Never Used    Alcohol use No    Drug use: No    Sexual activity: Yes    Partners: Male    Birth control/ protection: IUD     Other Topics Concern   None on file     Social History Narrative    Moved from [place] 6 months ago.    Lives with husband and children 7yo and 29 yo sons.    No working.     Student english.    Feels safe at home.     The patient lives at home.    Allergies:  Review of Patient's Allergies indicates:   Penicillins             Hives   Latex                   Rash,  Other (See Comments)    Comment:blisters      Physical Exam:  BP 118/68  Pulse 63  Temp 98.2 F  Resp 18  LMP 09/19/2014 (Approximate)  SpO2 98%    GENERAL:  WDWN, no acute distress, non-toxic   SKIN: Lacy erythematous rash to anterior chest and anterior throat.  No current urticaria.  No vesicles.  No signs of cellulitis.  No edema.  HEAD:  NCAT. Sclerae are anicteric and aninjected, oropharynx is clear with moist mucous membranes. PERRL. EOMI. B TMs clear.  NECK:  No C-spine tenderness, crepitus, step-off.  No meningismus.  No LAN. No stridor.  LUNGS:  Clear to auscultation bilaterally. No wheezes, rales, rhonchi.   HEART:  RRR.  No murmurs, rubs, or gallops.   ABDOMEN:  Soft, NTND.  No hepatosplenomegaly.  No masses.  No involuntary guarding or rebound.   GENITOURINARY:  No CVA tenderness.   NEUROLOGIC:  Alert and oriented x4; moves all extremities well; speaking in clear fluent sentences. Normal gait without ataxia; nonfocal.       ED Course and Medical Decision-making:    Allergic reaction from unclear precipitant, likely food.  Given photograph on phone that shows diffuse urticaria, decided to give patient a short  course of oral steroids.  Also advised to take Benadryl and Pepcid p.r.n. prescription given for EpiPen.  Patient advised to follow-up for allergen skin testing as an outpatient.    Reasons to return to the ED were reviewed in detail. The patient agrees with this plan and disposition.    Condition on Discharge: Improved and Stable        Diagnosis/Diagnoses:  Allergic reaction, initial encounter

## 2014-12-22 ENCOUNTER — Ambulatory Visit (HOSPITAL_BASED_OUTPATIENT_CLINIC_OR_DEPARTMENT_OTHER): Payer: Commercial Managed Care - PPO

## 2014-12-22 ENCOUNTER — Encounter (HOSPITAL_BASED_OUTPATIENT_CLINIC_OR_DEPARTMENT_OTHER): Payer: Self-pay

## 2014-12-22 DIAGNOSIS — Z9882 Breast implant status: Principal | ICD-10-CM

## 2014-12-22 NOTE — Progress Notes (Signed)
Brenda Braun is a 34 year old woman who had a breast augmentation in [place] 9 years ago.  She would like to replace her implants.  She feels that her breasts have gotten larger and that she is having back pain.  She would like smaller implants.  She also feels that her breasts are droopy again.  She would like to have a new lift.  She is currently a 38C and she would like to be a B cup.  No FH of breast cancer.        Past Medical History    Pyelonephritis        Past Surgical History    LAPAROSCOPY SURG CHOLECYSTECTOMY  2007    LIPOSUCTION      OB ANTEPARTUM CARE CESAREAN DLVR & POSTPARTUM      CHOLECYSTECTOMY       Review of Patient's Allergies indicates:   Penicillins             Hives   Latex                   Rash, Other (See Comments)    Comment:blisters      Current Outpatient Prescriptions on File Prior to Visit:  cetirizine (ZYRTEC) 5 MG tablet Take 1 tablet by mouth daily Disp: 30 tablet Rfl: 11   cholecalciferol (VITAMIN D3) 2000 UNIT tablet Take 1 tablet by mouth daily. To pharm: pls d/c all other Vit D Rx Disp: 90 tablet Rfl: 3   Prenatal Vit-Fe Fumarate-FA (PRENATAL VITAMINS) 28-0.8 MG TABS Take 1 tablet by mouth daily Disp: 90 tablet Rfl: 3   acyclovir (ZOVIRAX) 5 % ointment Apply  topically every 3 (three) hours. Disp: 30 g Rfl: 3     No current facility-administered medications on file prior to visit.     Non-smoker    PE: Bilateral breast implants in place.  Right is soft and mobile.  Left is sitting high and lateral.  Wise pattern scars.  No masses. SN-N distance is 23.5cm bilaterally.  BW is 14cm bilaterally.  N-F distance is 13cm bilaterally.      A/P:  Brenda Braun is a 34 year old female with a history of BAM/mastopexy in [place] 9 years ago.  She wants smaller breasts.  We could certainly remove her implants and re-do the lift.  She wants to keep her implants but wants smaller ones.  We could put in smaller ones and re-do the mastopexy.  We have given her a quote for this.  She will think  about it and get back to us.

## 2015-01-18 ENCOUNTER — Ambulatory Visit (HOSPITAL_BASED_OUTPATIENT_CLINIC_OR_DEPARTMENT_OTHER): Payer: Commercial Managed Care - PPO | Admitting: Family Medicine

## 2015-01-18 DIAGNOSIS — Z23 Encounter for immunization: Principal | ICD-10-CM

## 2015-01-18 NOTE — Progress Notes (Signed)
Influenza Vaccine Procedure  January 18, 2015    1. Has the patient received the information for the influenza vaccine? Yes    2. Does the patient have any of the following contraindications?  Allergy to eggs? No  Allergic reaction to previous influenza vaccines? No  Any other problems to previous influenza vaccines? No  Paralyzed by Guillain-Barre syndrome?  No  Current moderate or severe illness? No  Allergy to contact lens solution? No    3. The vaccine has been administered in the usual fashion.     Immunization information reviewed. Current VIS reviewed and given to patient/ guardian. Verbal assent obtained from patient/ guardian.  See immunization/Injection module or chart review for date of publication and additional information. Verbal assent obtained from patient/guardian. Comfort measures for possible side effects reviewed.

## 2015-05-08 ENCOUNTER — Encounter (HOSPITAL_BASED_OUTPATIENT_CLINIC_OR_DEPARTMENT_OTHER): Payer: Self-pay | Admitting: Family Medicine

## 2015-05-08 ENCOUNTER — Ambulatory Visit (HOSPITAL_BASED_OUTPATIENT_CLINIC_OR_DEPARTMENT_OTHER): Payer: Commercial Managed Care - PPO | Admitting: Family Medicine

## 2015-05-08 VITALS — BP 100/64 | HR 60 | Temp 97.5°F | Wt 178.0 lb

## 2015-05-08 DIAGNOSIS — L508 Other urticaria: Secondary | ICD-10-CM

## 2015-05-08 DIAGNOSIS — R1013 Epigastric pain: Secondary | ICD-10-CM

## 2015-05-08 DIAGNOSIS — R1084 Generalized abdominal pain: Principal | ICD-10-CM

## 2015-05-08 DIAGNOSIS — K5900 Constipation, unspecified: Secondary | ICD-10-CM

## 2015-05-08 LAB — URINE PREGNANCY TEST (POINT OF CARE): HCG QUALITATIVE URINE: NEGATIVE

## 2015-05-08 MED ORDER — RANITIDINE HCL 150 MG PO CAPS
150.00 mg | ORAL_CAPSULE | Freq: Two times a day (BID) | ORAL | 0 refills | Status: AC
Start: 2015-05-08 — End: 2015-06-07

## 2015-05-08 MED ORDER — PSYLLIUM 58.6 % PO PACK
1.00 | PACK | Freq: Every day | ORAL | 2 refills | Status: AC
Start: 2015-05-08 — End: 2015-08-06

## 2015-05-08 MED ORDER — DOCUSATE SODIUM 100 MG PO CAPS
100.00 mg | ORAL_CAPSULE | Freq: Two times a day (BID) | ORAL | 0 refills | Status: AC | PRN
Start: 2015-05-08 — End: 2015-06-07

## 2015-05-08 NOTE — Progress Notes (Signed)
[facility]    Subjective:   Brenda Braun is a 35 year old female who is here for:  Sick visit   - has been having abd pain  - feels bloated   - pain is in the upper abd   - been going on for 1 week  - unsure what is the cause  - feels nausea  - usually has BM daily, but is currently no moving daily  - last BM was yesterday - very small BM with much difficulty   - been over a week since moved bowels normally   - no diarrhea  - no blood or melena  - no vomiting  - sexually active - LMP 04/16/15, seemed like a normal period, lasted 3 days. 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 To pharm: pls d/c all other Vit D Rx, Disp: 90 tablet, Rfl: 3    Review of Patient's Allergies indicates:   Penicillins             Hives   Latex                   Rash, Other (See Comments)    Comment:blisters    Objective:  BP 100/64   Pulse 60   Temp 97.5 F (36.4 C) (Temporal)   Wt 80.7 kg (178 lb)   LMP 04/16/2015   SpO2 100%   BMI 30.08 kg/m2  GEN:  Well appearing, NAD  HEENT: NC/AT, PERRL, TMs pearly with normal light reflex, oropharynx without erythema or exudate, MMM.   CV: RRR, no M/R/G   Resp: CTAB, no W/R/R   Abd: NABS, soft, minimally tender throughout without rebound or guarding, ND, no organomegaly appreciated   Ext: WWP, no edema     Urine pregnancy: NEG    Assessment and Plan:  Brenda Braun is a 35 year old female with    1. Generalized abdominal pain  2. Constipation, unspecified constipation type  3. Dyspepsia  Pt p/w 1 week of abd pain in the setting of constipation. No normal BM in this time - straining to go to the bathroom, small hard stools. Feels bloated after eating. No blood in stool, no melena, no emesis. Symptoms seem to stem from constipation. Abd exam benign, urine pregnancy test negative. Possible that there is a gastritis component, as has some epigastric pain. Patient looking to have testing for bacteria/parasites as cause. Will check H pylori given recently immigrated from [place]. Will hold on further testing unless symptoms persist after treatment of constipation and dyspepsia.   - Urine Pregnancy (Point of Care)  - ranitidine (ZANTAC) 150 MG capsule; Take 1 capsule by mouth 2 (two) times daily  Dispense: 60 capsule; Refill: 0  - psyllium sugar free 58.6 %; Take 1 packet by mouth daily  Dispense: 30 packet; Refill: 2  - docusate sodium (COLACE) 100 MG capsule; Take 1 capsule by mouth 2 (two) times daily as needed for Constipation  Dispense: 60 capsule; Refill: 0  - Helicobacter Pylori Stool Antigen; Future    4. Recurrent urticaria  At the end of the visit patient wishes to discuss intermittent rashes that she has been having. Due to time constraints, unable to fully explore this today and she does not currently have a rash. Per ED note from 10/2014 the patient had urticaria and she reports that it is the same rash that happens. She would like to see allergist about this. Will refer to ENT for possible allergy testing for what sounds like recurrent urticaria.   - REFERRAL TO ENT ( INT)     I have reviewed the past medical and surgical history. Medications were reconciled during this visit and a current medication list was given to the patient at the end of the visit.    We discussed the patients current medications. The patient expressed understanding and no barriers to  adherence were identified.     Discussed with Dr. Nena AlexanderPanchal     Brenda Homan,MD, 05/08/2015, 10:52 AM

## 2015-05-08 NOTE — Progress Notes (Signed)
Preceptor Note  I personally reviewed this case, along with the patient's history and exam findings, with Dr.Dennison. I confirm the findings, and agree with the assessment and plan, as documented in the visit note.    Subhan Hoopes S. Naijah Lacek, MD

## 2015-05-09 ENCOUNTER — Encounter (HOSPITAL_BASED_OUTPATIENT_CLINIC_OR_DEPARTMENT_OTHER): Payer: Self-pay | Admitting: Family Medicine

## 2015-05-10 ENCOUNTER — Ambulatory Visit (HOSPITAL_BASED_OUTPATIENT_CLINIC_OR_DEPARTMENT_OTHER): Payer: Commercial Managed Care - PPO

## 2015-05-10 DIAGNOSIS — R1084 Generalized abdominal pain: Principal | ICD-10-CM

## 2015-05-10 DIAGNOSIS — R1013 Epigastric pain: Secondary | ICD-10-CM

## 2015-05-10 NOTE — Progress Notes (Signed)
Stool dropped off

## 2015-05-11 ENCOUNTER — Encounter (HOSPITAL_BASED_OUTPATIENT_CLINIC_OR_DEPARTMENT_OTHER): Payer: Self-pay | Admitting: Family Medicine

## 2015-05-11 LAB — HELICOBACTER PYLORI STOOL AG

## 2015-05-11 NOTE — Progress Notes (Signed)
Letter sent to patient with normal test results

## 2015-06-06 ENCOUNTER — Ambulatory Visit (HOSPITAL_BASED_OUTPATIENT_CLINIC_OR_DEPARTMENT_OTHER): Payer: Commercial Managed Care - PPO | Admitting: Otolaryngology

## 2015-06-19 ENCOUNTER — Ambulatory Visit (HOSPITAL_BASED_OUTPATIENT_CLINIC_OR_DEPARTMENT_OTHER): Payer: Commercial Managed Care - PPO | Admitting: Family Medicine

## 2015-06-19 ENCOUNTER — Encounter (HOSPITAL_BASED_OUTPATIENT_CLINIC_OR_DEPARTMENT_OTHER): Payer: Self-pay | Admitting: Family Medicine

## 2015-06-19 VITALS — BP 110/70 | HR 73 | Temp 97.1°F | Wt 174.0 lb

## 2015-06-19 DIAGNOSIS — J0110 Acute frontal sinusitis, unspecified: Principal | ICD-10-CM

## 2015-06-19 DIAGNOSIS — E039 Hypothyroidism, unspecified: Secondary | ICD-10-CM

## 2015-06-19 LAB — FREE THYROXINE: FREE THYROXINE: 0.77 ng/dL (ref 0.76–1.46)

## 2015-06-19 LAB — THYROID SCREEN TSH REFLEX FT4: THYROID SCREEN TSH REFLEX FT4: 10 u[IU]/mL — ABNORMAL HIGH (ref 0.358–3.740)

## 2015-06-19 MED ORDER — LEVOTHYROXINE SODIUM 112 MCG PO TABS
112.0000 ug | ORAL_TABLET | Freq: Every day | ORAL | 0 refills | Status: DC
Start: 2015-06-19 — End: 2015-06-20

## 2015-06-19 MED ORDER — DOXYCYCLINE HYCLATE 100 MG PO TABS
200.00 mg | ORAL_TABLET | Freq: Every day | ORAL | 0 refills | Status: AC
Start: 2015-06-19 — End: 2015-06-24

## 2015-06-19 NOTE — Progress Notes (Signed)
Family Medicine Office Note    Subjective:  1 week, facial pain, runny nose:  Cant sleep  + Sore throat  + Body aches  Son is also sick  Runny eyes - both eyes - itchy and irritated  Pain in the face - bilaterally  + Tooth sensitivity  No documented fevers  No nausea, no vomiting, no abdominal pain  Urinating well without pain  No diarrhea  Coughing a little  Had this previously last year  Symptoms have been getting worse  Tried atrovent nebulizer that she had from [place]  Tried allegra, tea, vicks vapor  Tried an antihistamine  Tried afrin  Tried a saline nasal spray  Also tried flonase    Hypothyroidism:  On thyroid medication and needs a refill  TSH (THYROID STIM HORMONE) (uIU/mL)   Date Value   07/12/2014 5.440 (H)   04/18/2014 2.980   03/14/2014 6.900 (H)   Has not had any symptoms    ROS: 10 point ROS was negative except for symptoms listed above    Objective:  EXAM:  BP 110/70   Pulse 73   Temp 97.1 F (36.2 C) (Temporal)   Wt 78.9 kg (174 lb)   SpO2 98%   BMI 29.41 kg/m2  GEN: No acute distress  CARD: S1 and S2 normal, no murmurs, clicks, gallops or rubs. Regular rate and rhythm.  CHEST: Chest is clear, no wheezing or rales. Normal symmetric air entry throughout both lung fields.  HEENT: Right eye with injected conjunctiva. Pupils equal, reactive to light, Extraocular movement intact; TM intact without perforation or effusion, external canal normal. No significant cerumenosis noted;  Oral cavity, tongue, pharynx and palate have no inflammation or suspicious lesions. Facial tenderness over maxillary sinuses bilaterally  MOOD: Normal thought content, speech, affect, mood and dress are noted.    ASSESSMENT/PLAN:  Brenda Braun is a 35 year old here for:    1. Acute frontal sinusitis, recurrence not specified  Possibly viral but given duration of symptoms, tooth sensitivity, tenderness given a prescription for doxycycline (rash with penicillins).  Discussed reasons to call back or return to clinic.  Also  discussed importance of hand hygiene and over the counter medication to help with symptoms of sinusiitis.   - doxycycline (VIBRA-TABS) 100 MG tablet; Take 2 tablets by mouth daily  Dispense: 10 tablet; Refill: 0    2. Hypothyroidism, unspecified type  Refilled levothyroxine along with testing TSH today  - levothyroxine (LEVOTHROID) 112 MCG tablet; Take 1 tablet by mouth daily  Dispense: 90 tablet; Refill: 0  - Thyroid Screen TSH Reflex FT4      I have spent 25 minutes in face to face time with this patient/patient proxy of which > 50% was in counseling or coordination of care regarding above issues/Dx.    Seen by and discussed with Dr. Kela MillinPong    **AVS given and patient instructions reviewed with patient, who had the opportunity to ask questions. The pt verbalizes and demonstrated understanding. Advised to call/RTC with concerns.     Conny Situ E. Issaih Kaus,MD, 06/19/2015, 10:03 AM

## 2015-06-19 NOTE — Patient Instructions (Signed)
To shorten a cold:   · Umcka: This is an herb that can shorten the length of a cold, especially if you take it right when you first start feeling sick. They sell it at [employer]. It costs about $10 for about a week's supply.   · Echinacea: This does a similar thing but Umcka is probably a little better.      For congestion / runny nose:  · Oral: Sudafed (pseudoephedrine) 3 days max. This is the Sudafed that you ask for behind the counter, but you don't need a prescription.   · Nasal: Afrin (oxymetazoline) 2 sprays each nostril twice daily - You should NOT use this medication for longer than 3 days  · Steam, neti pot, hypertonic nasal saline spray     Pain & Aches:  · Ibuprofen 600-800mg every 8 hours (over the counter pills are 200mg so this would be 3-4 pills). Always take with food.   Or  · Tylenol (acetaminophen) 1000mg every 6 hours (Extra Strength pills are 500mg so take 2 for 1000mg dose)     Cough:   · Honey 2 tsps every 6 hours. Can mix in caffeine-free tea.   Or   · -Dry cough: Delsym 30mg  · -Wet cough: Guaifenesin 600mg, or Robitussin DM (guaifenesin with dextromethorphan)  · You can ask your doctor for Tessalon Pearls.      For sore throat:   · Ibuprofen as noted above  · Some people like over-the-counter Chloraseptic spray, which can numb the throat a little     For sleep:   · Benadryl before bed. It can sometimes also help with runny nose.      ALSO:  · Good hand hygiene to avoid spread germs (wash hands with soap and warm water)  · Drink plenty of fluids to stay hydrated (water, juice, seltzer, broth, gatorade, popsicles). Drink enough so that your pee is clear, not yellow!  Get plenty of rest to fight infection (no gym, exercise, alcohol)

## 2015-06-19 NOTE — Progress Notes (Signed)
PRECEPTOR NOTE:  On the day of the patient's visit, I discussed the key elements of the history and physical exam and I reviewed the findings with the resident. I personally interviewed and examined the patient and I agree with the assessment and plan as described in the documentation. Please see resident's note for further details.

## 2015-06-20 ENCOUNTER — Telehealth (HOSPITAL_BASED_OUTPATIENT_CLINIC_OR_DEPARTMENT_OTHER): Payer: Self-pay | Admitting: Registered Nurse

## 2015-06-20 ENCOUNTER — Telehealth (HOSPITAL_BASED_OUTPATIENT_CLINIC_OR_DEPARTMENT_OTHER): Payer: Self-pay | Admitting: Family Medicine

## 2015-06-20 DIAGNOSIS — E039 Hypothyroidism, unspecified: Principal | ICD-10-CM

## 2015-06-20 MED ORDER — LEVOTHYROXINE SODIUM 88 MCG PO TABS
88.0000 ug | ORAL_TABLET | Freq: Every day | ORAL | 0 refills | Status: AC
Start: 2015-06-20 — End: 2015-12-19

## 2015-06-20 NOTE — Progress Notes (Signed)
Pt states that she is having trouble sleeping at night because S&S discussed yesterday at OV.  Sinusitis,pain upper cheek and eye,yellow exudate from nose and eyes "goopy" in AM. Instructed pt to continue taking antibiotic and other meds as prescribed. Pt was not taking antibiotic as directed and had stopped taking Zyrtec. Instructed pt that if s&s do not improve to call to be seen.

## 2015-06-20 NOTE — Progress Notes (Signed)
Results reviewed, please see telephone encounter for discussion with patient

## 2015-06-20 NOTE — Progress Notes (Signed)
Called pt about TSH.  Pt reports that she went to the pharmacy 3 weeks ago when she ran out of levothyroxine and they said she was out of refills so she did not take any levothyroxine for 3 weeks prior to getting her TSH drawn.  She was previously taking levothyroxine 88 mcg.  Will keep her on the levothyroxine 88 mcg daily and recheck TSH in 6 weeks.    Results for Brenda Braun, Brenda Braun (MRN [id number]) as of 06/20/2015 18:13   Ref. Range 06/19/2015 11:08   FREE THYROXINE Latest Ref Range: 0.76 - 1.46 ng/dL 0.980.77   THYROID SCREEN TSH REFLEX FT4 Latest Ref Range: 0.358 - 3.740 uIU/mL 10.000 (H)

## 2015-06-23 ENCOUNTER — Other Ambulatory Visit (HOSPITAL_BASED_OUTPATIENT_CLINIC_OR_DEPARTMENT_OTHER): Payer: Self-pay

## 2015-06-23 ENCOUNTER — Telehealth (HOSPITAL_BASED_OUTPATIENT_CLINIC_OR_DEPARTMENT_OTHER): Payer: Self-pay | Admitting: Registered Nurse

## 2015-06-23 MED ORDER — KETOTIFEN FUMARATE 0.025 % OP SOLN
1.00 [drp] | Freq: Two times a day (BID) | OPHTHALMIC | 1 refills | Status: AC
Start: 2015-06-23 — End: 2015-06-30

## 2015-06-23 NOTE — Progress Notes (Signed)
Script filled.  Do you mind contacting the patient that zaditor was prescribed?    Also, copying Dr. Lavell Islamittner on this message as it seems that the patient was actually asking for Dr. Lavell Islamittner since he saw the patient earlier this week, but he wasn't originally copied.   Spencer, please let me know if you think an anti-histamine is inappropriate in this patient. Thx!   -Herschell DimesFa'iz     Brenda Wimbush Bayo-Awoyemi, MD (Resident), 06/23/15, 6:02 PM

## 2015-06-23 NOTE — Telephone Encounter (Signed)
-----   Message from Maryanna ShapeLumine Michel sent at 06/23/2015  2:40 PM EDT -----  Regarding: requesting prescrition for allergies  Contact: [phone]  Suzi RootsDaniele Beaumier [phone], 35 year old, female    Calls today:  Clinical Questions (NON-SICK CLINICAL QUESTIONS ONLY)    Requesting prescription for eye drops, states she saw Dr Lavell Islamittner Monday for allergies. States her eyes are burning would like to know if he is able to call re prescription.  [facility] [phone] [place]- [place], [place] - [address] [facility] [address] [facility] [place]  Return phone number [phone]  Person calling on behalf of patient: Patient (self)        Patient's language of care: [place]    Patient does not need an interpreter.    Patient's PCP: Caleen EssexFaiz Bayo-Awoyemi, MD

## 2015-06-23 NOTE — Telephone Encounter (Signed)
PapSmear Appt Booked Tuesday, June 6 at 11:00AM with Dr. Katherine Bassetevorah Donnell.

## 2015-06-24 ENCOUNTER — Telehealth (HOSPITAL_BASED_OUTPATIENT_CLINIC_OR_DEPARTMENT_OTHER): Payer: Self-pay | Admitting: Family Medicine

## 2015-06-24 NOTE — Progress Notes (Unsigned)
Called patient with interpreter to let her know prescription was sent for eye drops. Left message to return call

## 2015-07-18 ENCOUNTER — Ambulatory Visit (HOSPITAL_BASED_OUTPATIENT_CLINIC_OR_DEPARTMENT_OTHER): Payer: Self-pay | Admitting: Family Medicine

## 2015-08-07 ENCOUNTER — Telehealth (HOSPITAL_BASED_OUTPATIENT_CLINIC_OR_DEPARTMENT_OTHER): Payer: Self-pay | Admitting: Family Medicine

## 2015-08-07 NOTE — Progress Notes (Signed)
Returned call to pt and spoke with her via interpreter. She said she got a call from here asking her to come in for labs and wants to know why we called her. In review of chart, I do not see documentation of a call but informed her it was most likely to come in for her TSH. [place] and is not a patient here anymore. Will be establishing care there. Routing encounter to front desk staff to remove pt pt from Dr. Al DecantBayo's panel.

## 2015-08-07 NOTE — Telephone Encounter (Signed)
-----   Message from Ski GapJacqueline Figueroa sent at 08/07/2015  2:48 PM EDT -----  Regarding: Thyroid   Contact: [phone]  [name] [phone], 35 year old, female    Calls today:  Clinical Questions (NON-SICK CLINICAL QUESTIONS ONLY)      Specific nature of request pt is requesting  To speak with a nurse about her  Thyroid  Medication want a phone call back. thanks  Return phone number [phone]  Person calling on behalf of patient: Patient (self)  Patient's language of care: [place]    Patient does not need an interpreter.    Patient's PCP: Caleen EssexFaiz Bayo-Awoyemi, MD

## 2017-03-27 ENCOUNTER — Telehealth (HOSPITAL_BASED_OUTPATIENT_CLINIC_OR_DEPARTMENT_OTHER): Payer: Self-pay | Admitting: Health Educator

## 2017-03-27 NOTE — Progress Notes (Signed)
Outreach call  [place]  Very happy.  Wife had a baby boy who is now 158 month old.  [facility].    Burke KeelsMargarida Bedie Dominey, Mental Health Care Partner, 03/27/2017,12:57 PM

## 2023-08-06 IMAGING — MR RM - JOELHO DIREITO
2 of 7 series · 5 of 39 positions shown · non-contrast
Comparison: none

[Series 3: T1 · sagittal · 4.0mm · 0.17mm/px · 2 of 22 slices shown]
[im 4/22]
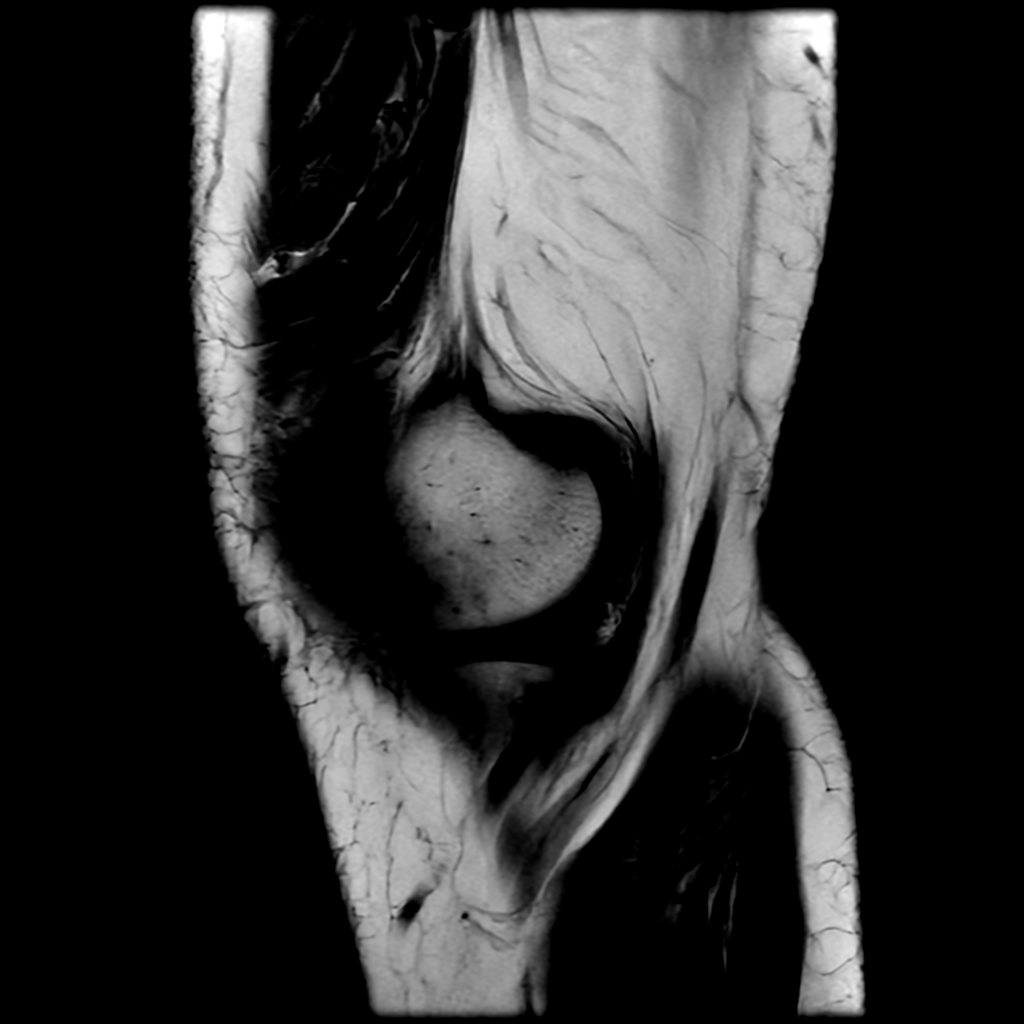
[im 11/22]
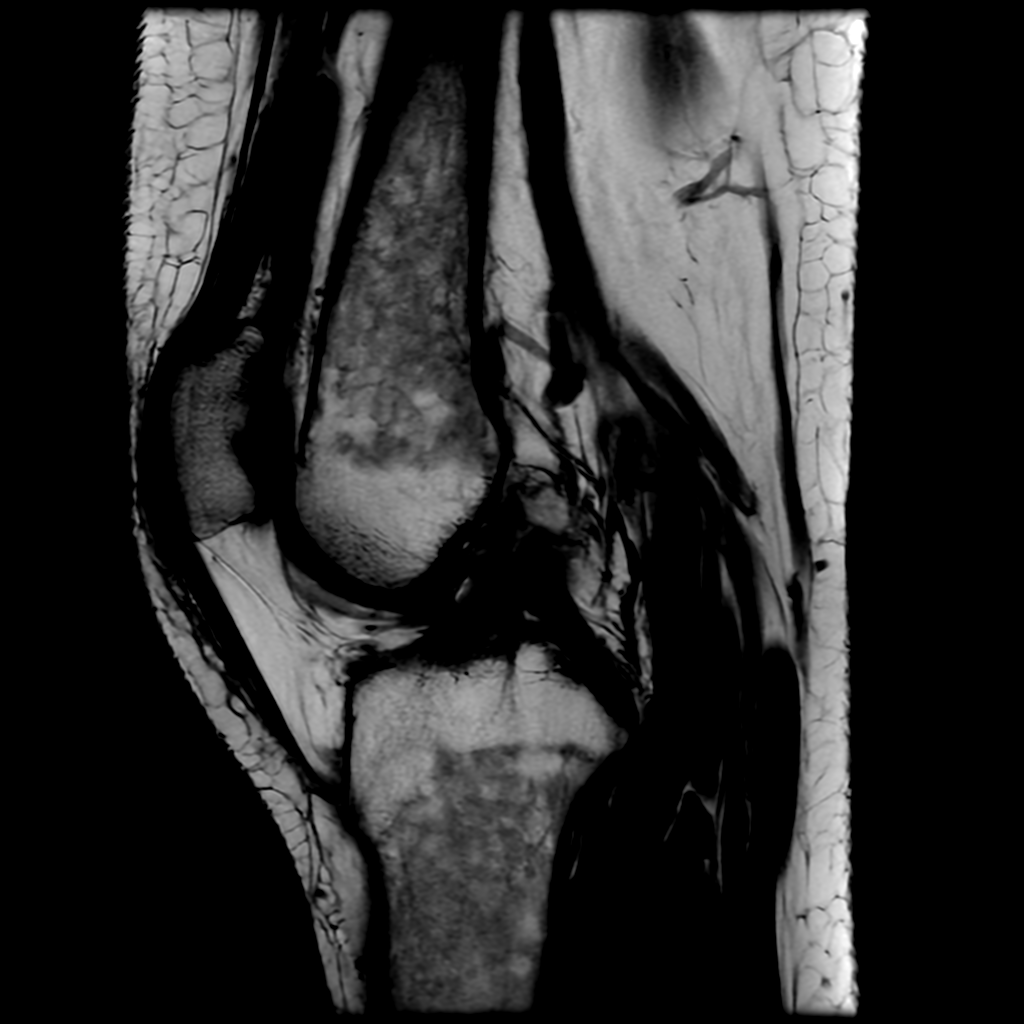

[Series 4: T2 fat-sat · sagittal · 4.0mm · 0.17mm/px · 3 of 22 slices shown]
[im 4/22]
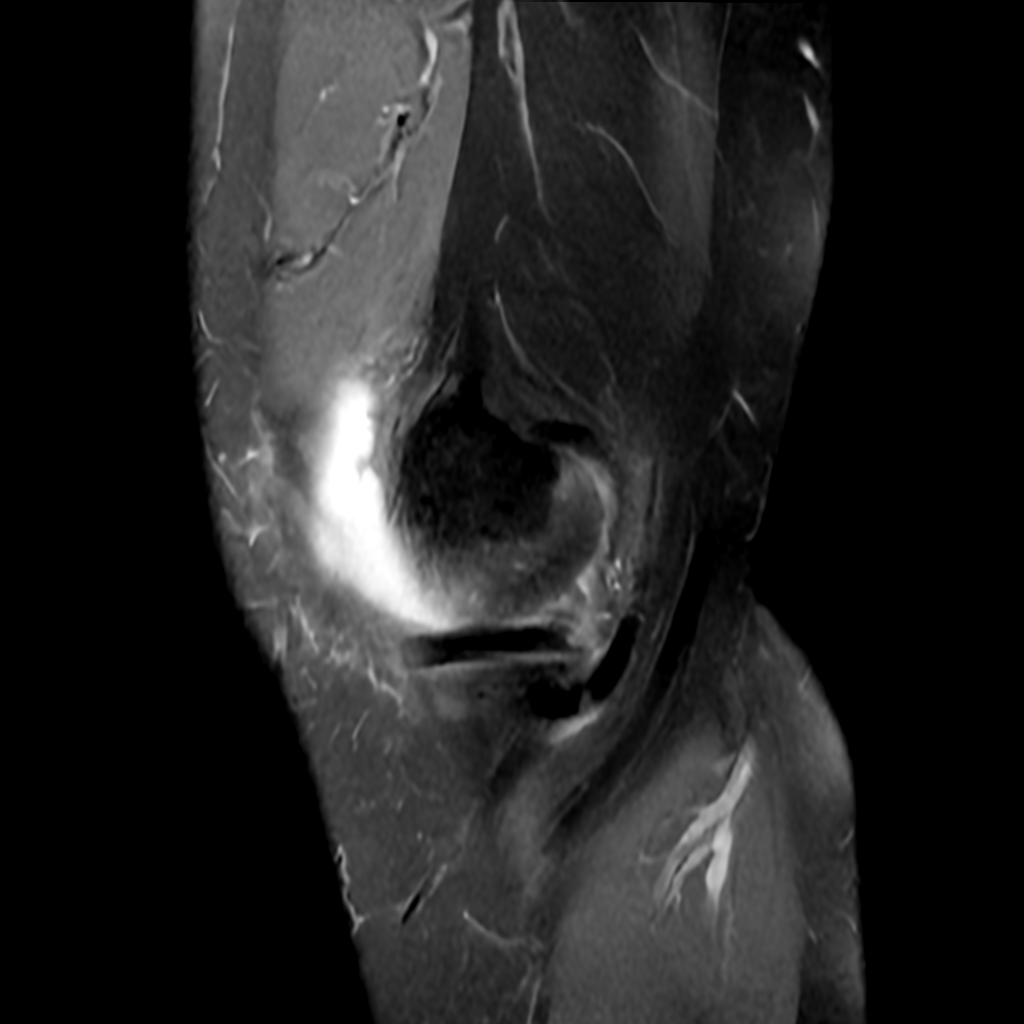
[im 11/22]
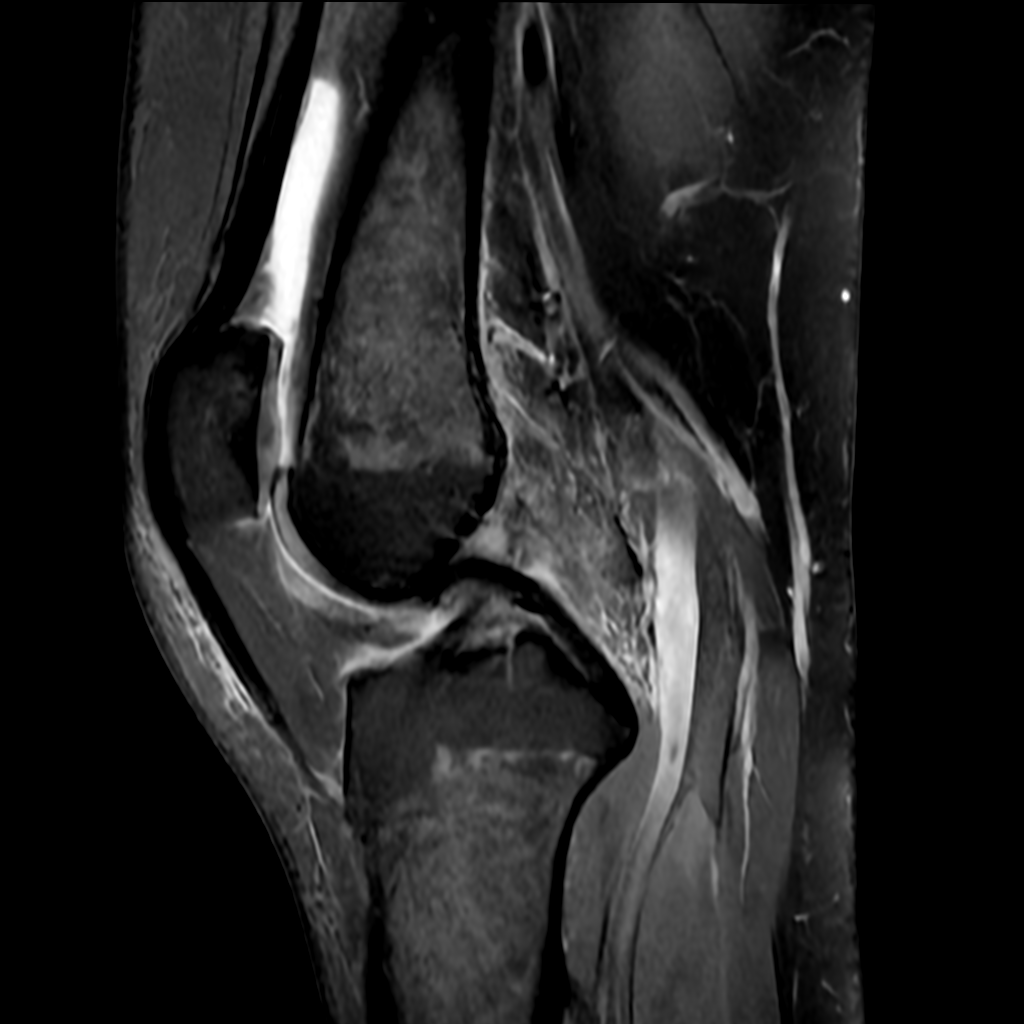
[im 18/22]
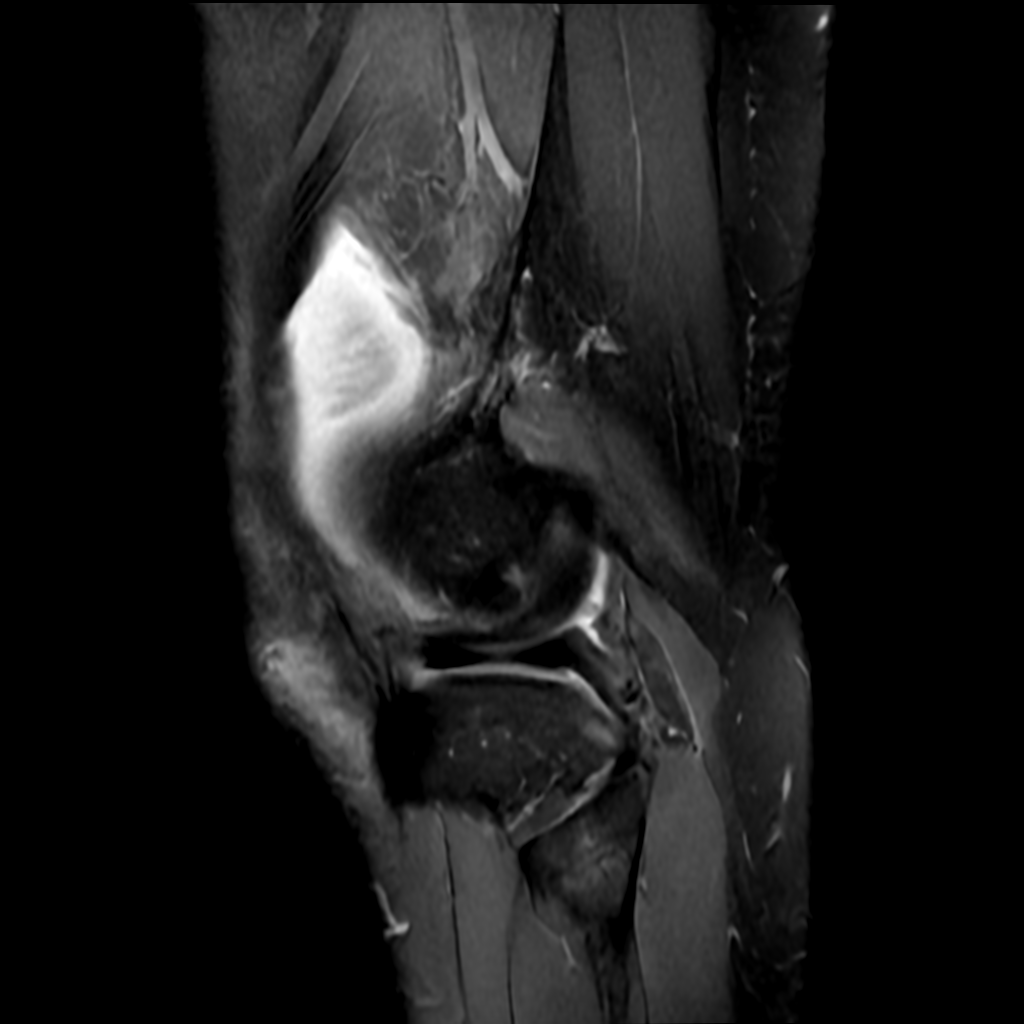

[5 of 39 positions shown; findings below may reference images not displayed]

RESSONÂNCIA MAGNÉTICA DO JOELHO DIREITO

TÉCNICA:

Exame realizado em equipamento de ressonância magnética com sequências, ponderações e planos específicos para o segmento de interesse, sem a administração endovenosa do meio de contraste.

RESULTADO:

Meniscos com forma, contornos e sinal preservados, sem sinais de lesão.
Ligamentos cruzados e colaterais com continuidade, espessura e sinal conservados.
Afilamento difuso da cartilagem patelar com fissuras profundas no vértice.
Estruturas ósseas com morfologia e sinal medular conservados.
Tendões quadricipital, patelar, bíceps femoral distal, poplíteo, trato iliotibial e tendões da pata de ganso sem particularidades.
Pequeno derrame articular no recesso superior.
Fossa poplítea sem formações císticas.

CONCLUSÃO:

Condropatia patelar grau 3.
Derrame articular.
